# Patient Record
Sex: Female | Born: 1975 | Race: White | Hispanic: No | Marital: Single | Smoking: Never smoker
Health system: Southern US, Academic
[De-identification: ages and names within clinical notes are randomized; demographics above are authoritative.]

## PROBLEM LIST (undated history)

## (undated) DIAGNOSIS — K219 Gastro-esophageal reflux disease without esophagitis: Secondary | ICD-10-CM

## (undated) DIAGNOSIS — C4492 Squamous cell carcinoma of skin, unspecified: Secondary | ICD-10-CM

## (undated) DIAGNOSIS — M329 Systemic lupus erythematosus, unspecified: Secondary | ICD-10-CM

## (undated) DIAGNOSIS — F411 Generalized anxiety disorder: Secondary | ICD-10-CM

## (undated) DIAGNOSIS — M1990 Unspecified osteoarthritis, unspecified site: Secondary | ICD-10-CM

## (undated) DIAGNOSIS — N200 Calculus of kidney: Secondary | ICD-10-CM

## (undated) DIAGNOSIS — D649 Anemia, unspecified: Secondary | ICD-10-CM

## (undated) DIAGNOSIS — T879 Unspecified complications of amputation stump: Secondary | ICD-10-CM

## (undated) DIAGNOSIS — M797 Fibromyalgia: Secondary | ICD-10-CM

## (undated) DIAGNOSIS — E119 Type 2 diabetes mellitus without complications: Secondary | ICD-10-CM

## (undated) DIAGNOSIS — G5600 Carpal tunnel syndrome, unspecified upper limb: Secondary | ICD-10-CM

## (undated) DIAGNOSIS — E559 Vitamin D deficiency, unspecified: Secondary | ICD-10-CM

## (undated) DIAGNOSIS — Z8719 Personal history of other diseases of the digestive system: Secondary | ICD-10-CM

## (undated) DIAGNOSIS — K449 Diaphragmatic hernia without obstruction or gangrene: Secondary | ICD-10-CM

## (undated) DIAGNOSIS — F32A Depression, unspecified: Secondary | ICD-10-CM

## (undated) DIAGNOSIS — F4310 Post-traumatic stress disorder, unspecified: Secondary | ICD-10-CM

## (undated) DIAGNOSIS — G2581 Restless legs syndrome: Secondary | ICD-10-CM

## (undated) DIAGNOSIS — J45909 Unspecified asthma, uncomplicated: Secondary | ICD-10-CM

## (undated) DIAGNOSIS — G43909 Migraine, unspecified, not intractable, without status migrainosus: Secondary | ICD-10-CM

## (undated) HISTORY — PX: LEG AMPUTATION: SHX1105

## (undated) HISTORY — DX: Unspecified osteoarthritis, unspecified site: M19.90

## (undated) HISTORY — DX: Unspecified complications of amputation stump: T87.9

## (undated) HISTORY — DX: Vitamin D deficiency, unspecified: E55.9

## (undated) HISTORY — PX: LAMINECTOMY: SHX219

## (undated) HISTORY — DX: Squamous cell carcinoma of skin, unspecified: C44.92

## (undated) HISTORY — DX: Migraine, unspecified, not intractable, without status migrainosus: G43.909

## (undated) HISTORY — PX: SPINAL FUSION: SHX223

## (undated) HISTORY — DX: Unspecified asthma, uncomplicated: J45.909

## (undated) HISTORY — DX: Fibromyalgia: M79.7

## (undated) HISTORY — DX: Generalized anxiety disorder: F41.1

## (undated) HISTORY — PX: REPLACEMENT TOTAL KNEE BILATERAL: SUR1225

## (undated) HISTORY — DX: Calculus of kidney: N20.0

## (undated) HISTORY — DX: Post-traumatic stress disorder, unspecified: F43.10

## (undated) HISTORY — DX: Systemic lupus erythematosus, unspecified: M32.9

## (undated) HISTORY — DX: Type 2 diabetes mellitus without complications: E11.9

## (undated) HISTORY — DX: Diaphragmatic hernia without obstruction or gangrene: K44.9

## (undated) HISTORY — DX: Personal history of other diseases of the digestive system: Z87.19

## (undated) HISTORY — PX: HX CERVICAL DISKECTOMY: SHX98

## (undated) HISTORY — DX: Gastro-esophageal reflux disease without esophagitis: K21.9

## (undated) HISTORY — DX: Restless legs syndrome: G25.81

## (undated) HISTORY — PX: HX GALL BLADDER SURGERY/CHOLE: SHX55

## (undated) HISTORY — DX: Anemia, unspecified: D64.9

## (undated) HISTORY — DX: Depression, unspecified: F32.A

## (undated) HISTORY — PX: HX APPENDECTOMY: SHX54

## (undated) HISTORY — PX: HX GASTRIC BYPASS: SHX52

## (undated) HISTORY — DX: Carpal tunnel syndrome, unspecified upper limb: G56.00

## (undated) HISTORY — PX: HX TONSILLECTOMY: SHX27

---
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
## 2005-04-26 DIAGNOSIS — E119 Type 2 diabetes mellitus without complications: Secondary | ICD-10-CM | POA: Insufficient documentation

## 2005-04-26 DIAGNOSIS — J45909 Unspecified asthma, uncomplicated: Secondary | ICD-10-CM | POA: Insufficient documentation

## 2005-04-26 DIAGNOSIS — G4733 Obstructive sleep apnea (adult) (pediatric): Secondary | ICD-10-CM | POA: Insufficient documentation

## 2005-04-26 DIAGNOSIS — K219 Gastro-esophageal reflux disease without esophagitis: Secondary | ICD-10-CM | POA: Insufficient documentation

## 2005-04-26 DIAGNOSIS — M329 Systemic lupus erythematosus, unspecified: Secondary | ICD-10-CM | POA: Insufficient documentation

## 2005-12-18 DIAGNOSIS — E559 Vitamin D deficiency, unspecified: Secondary | ICD-10-CM | POA: Insufficient documentation

## 2006-08-15 DIAGNOSIS — I10 Essential (primary) hypertension: Secondary | ICD-10-CM | POA: Insufficient documentation

## 2006-08-15 DIAGNOSIS — F32A Depression, unspecified: Secondary | ICD-10-CM | POA: Insufficient documentation

## 2010-01-31 DIAGNOSIS — M797 Fibromyalgia: Secondary | ICD-10-CM | POA: Insufficient documentation

## 2010-01-31 DIAGNOSIS — G2581 Restless legs syndrome: Secondary | ICD-10-CM | POA: Insufficient documentation

## 2010-01-31 DIAGNOSIS — M179 Osteoarthritis of knee, unspecified: Secondary | ICD-10-CM | POA: Insufficient documentation

## 2010-01-31 DIAGNOSIS — G5600 Carpal tunnel syndrome, unspecified upper limb: Secondary | ICD-10-CM | POA: Insufficient documentation

## 2010-01-31 DIAGNOSIS — E669 Obesity, unspecified: Secondary | ICD-10-CM | POA: Insufficient documentation

## 2010-01-31 DIAGNOSIS — Z7952 Long term (current) use of systemic steroids: Secondary | ICD-10-CM | POA: Insufficient documentation

## 2019-07-29 NOTE — Progress Notes (Signed)
CARE TEAM:  Patient Care Team:  No Pcp as PCP - General (General Practice)    ASSESSMENT  1. Primary osteoarthritis of both knees    2. 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 Trochanteric bursitis of left hip       There is no problem list on file for this patient.    Impression:  44 year old female comes in today with primary osteoarthritis of both knees with severe degenerative joint disease of all 3 compartments of both knees.  She is a 47 BMI which will not allow her to have any type of surgical procedure until she gets under 40 BMI.  She does have some hip pain along the lateral sides which demonstrate trochanteric bursitis.      PLAN  Treatment Plan:  Left trochanteric bursa injection was performed today and was taken very well.  We did have a long discussion about her bilateral knee osteoarthritis the patient will need to lose weight because she desperately needs a total knee arthroplasty.  This will have to be in both knees because of considerable osteoarthritis and degenerative joint disease even at a young age of 37. We discussed cortisone injections as well as viscosupplementation injections and she has had both which helped for just some time however does not do anything along length of time.  We discussed diet weight control as well as obesity with her diabetes.  This will need to be taken control of and worked with possibly a dietitian which we can make a referral if possible.    Orders Placed This Encounter   . BMI >=25 PATIENT INSTRUCTIONS & EDUCATION   . BP Elevated Patient Education & Instructions      Follow-up: Return if symptoms worsen or fail to improve, for Follow Up.      HISTORY OF PRESENT ILLNESS  Chief Complaint: Pain of the Left Knee and Pain of the Right Knee     Age: 44 y.o.    Sex: female   Occupation:   Hand-dominance: Right    History of present illness: Ms. Bayliff presents today for evaluation of     Left Knee  Right Knee    Patient comes in today with bilateral knee pain she brought x-rays in that show severe osteoarthritis and degenerative joint disease.  She has had this ever since she was in her late 28s.  And is only gotten worse over time.  She has had injections of gel and cortisone physical therapy and knee even gastric bypass.  She does have diabetes that is well controlled however still gives her issues with a history of an autoimmune disease.  Today the patient is here for bilateral knee pain and left hip pain.  4/10 on the pain scale but does not wane or change        OBJECTIVE  Constitutional:  No acute distress. Her body mass index is 46.52 kg/m.   Eyes:  Sclera are nonicteric.  Respiratory:  No labored breathing.  Cardiovascular:  No marked edema.  Skin:  No marked skin ulcers.  Neurological:  No marked sensory loss noted.  Psychiatric: Alert and oriented x3.    Musculoskeletal     Knee  Right Knee ROM:  Flexion: with pain and 115 degrees  Extension: -3 degrees  Left Knee ROM:  Flexion: with pain and 115 degrees  Extension: -3 degrees  Skin:  Right  Knee: skin intact, no rashes or lesions.  Skin:  Left Knee: skin intact, no rashes or lesions.  Inspection:  Right Knee: medial joint line tenderness and varus alignment  Inspection:  Left Knee: medial joint line tenderness and varus alignment  Right Quadriceps: Strength: 5/5, normal muscle tone.  Left Quadriceps: Strength: 5/5, normal muscle tone.  Right Hamstring: Strength: 5/5, normal muscle tone.  Left Hamstring: Strength: 5/5, normal muscle tone.  Stability:  Right Knee: Stable  Stability:  Left Knee: Stable  Special:  Right Knee: McMurray Test: positive medial and Steinman's Grind Test: positive medial  Special:  Left Knee: McMurray Test: positive medial and Steinman's Grind Test: positive medial    Pinpoint tenderness over the left trochanteric bursa with a positive compression test  IMAGING / STUDIES         No imaging obtained   Bilateral  knee x-rays show severe degenerative joint disease of the medial joint space lateral joint space and patellofemoral joint space.  No fractures dislocations or subluxations however chronic osteophyte formation seen at the superior portion of the medial condyle and lateral condyle of both knees bilaterally.  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 as well    PROCEDURES  Large Joint Arthrocentesis: L greater trochanteric bursa  Date/Time: 07/29/2019 11:30 AM  Consent given by: patient  Site marked: site marked  Timeout: Immediately prior to procedure a time out was called to verify the correct patient, procedure, equipment, support staff and site/side marked as required   Supporting Documentation  Indications: pain   Procedure Details  Location: Hip L greater trochanteric bursa   Preparation: Patient was prepped and draped in the usual sterile fashion, ethylchloride used.Needle size: 25 G  Approach: lateral  Patient tolerance: patient tolerated the procedure well with no immediate complications      Medications administered: 1 mL Betamethasone 6 (3-3) MG/ML    Patient Education: Patient Education Distributed, Cortisone Flare Risk Discussed and Diabetes Risk Discussed          Supervising physician:Bart Jonathon Bellows

## 2020-04-06 IMAGING — MR MRI JOINT LOWER EXTREMITY WITHOUT CONTRAST LT
5 series · 38 of 40 positions shown · IV contrast (gadolinium)
Comparison: Radiographs dated 07/15/2019.

﻿EXAM:  MRI JOINT LOWER EXTREMITY WITHOUT CONTRAST LT KNEE
INDICATION: Chronic left knee pain.
TECHNIQUE: Multiplanar, multisequential MRI of the left knee joint was performed without gadolinium contrast.

[Series 5: PD fat-sat · axial · left · 4.0mm · 0.33mm/px · z∈[-54,+59]mm · 7 of 26 slices shown (1 of 3)]
[im 1/26]
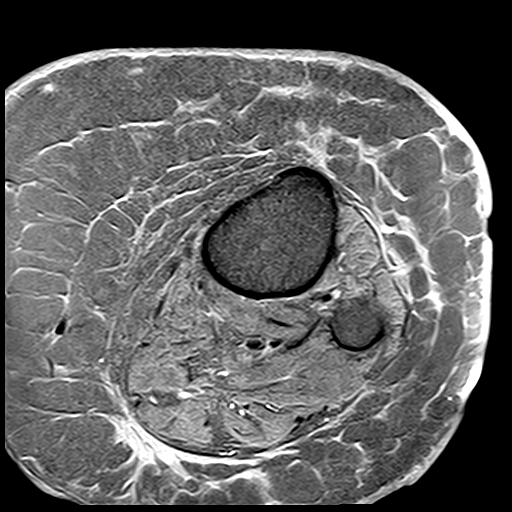
[im 5/26]
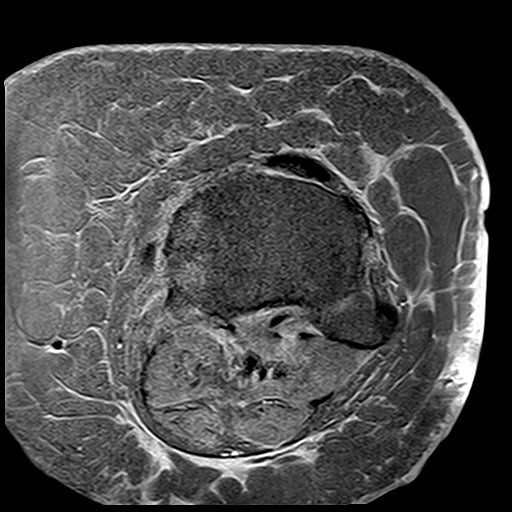
[im 9/26]
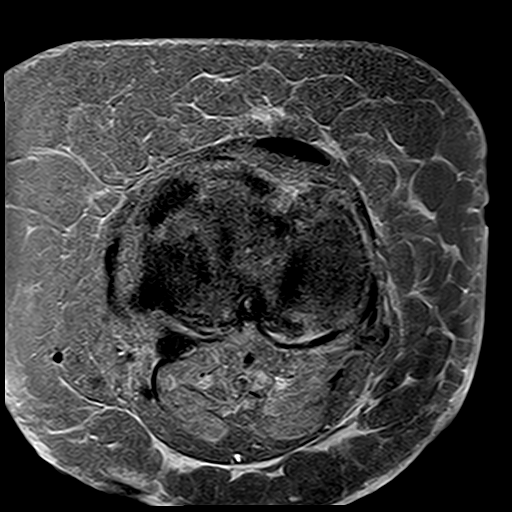
[im 13/26]
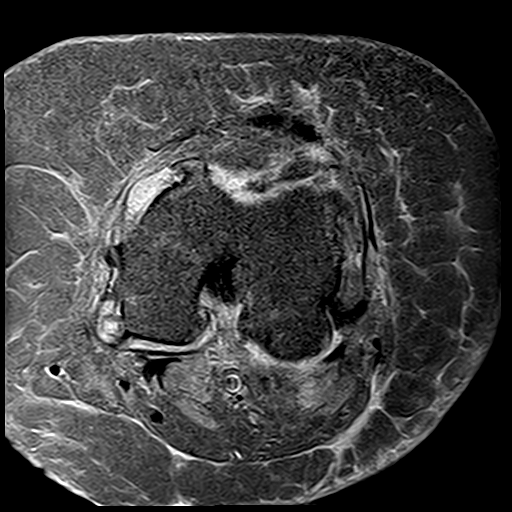
[im 17/26]
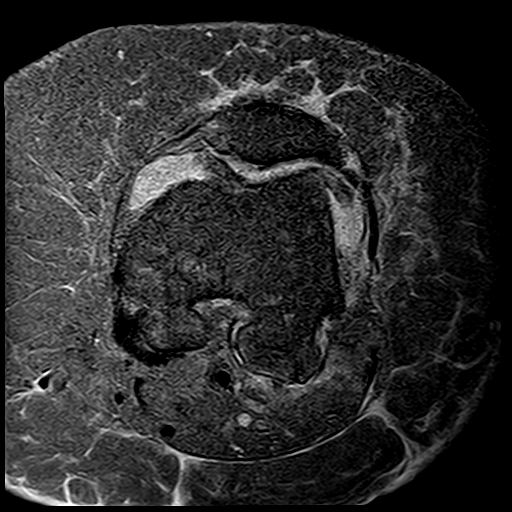
[im 21/26]
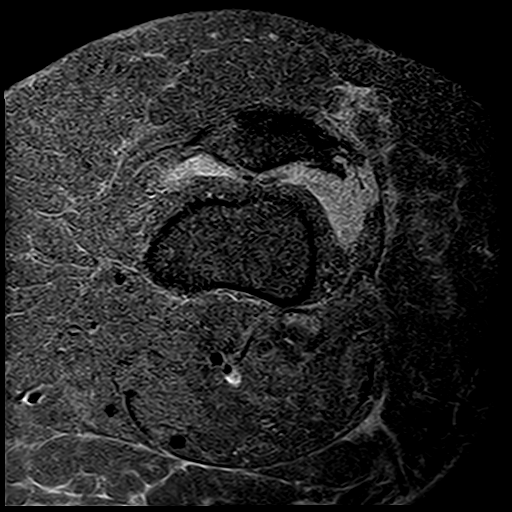
[im 26/26]
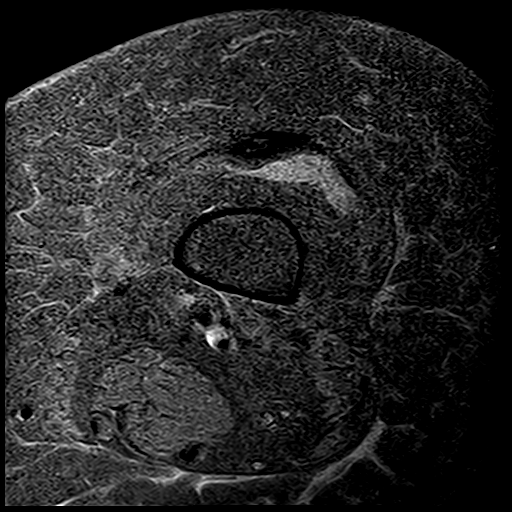

[Series 6: PD fat-sat · sagittal · left · 3.5mm · 0.53mm/px · 7 of 26 slices shown (2 of 3)]
[im 1/26]
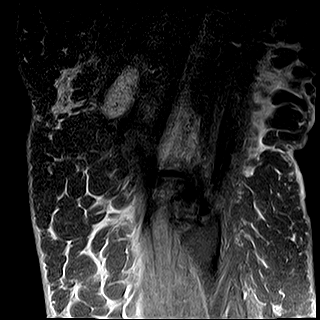
[im 5/26]
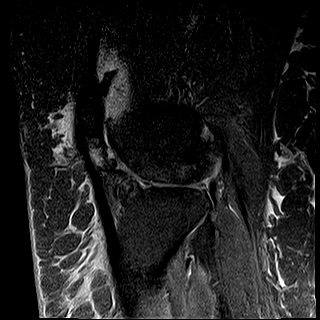
[im 9/26]
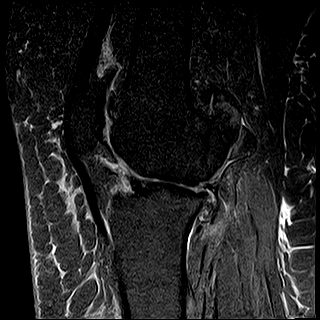
[im 13/26]
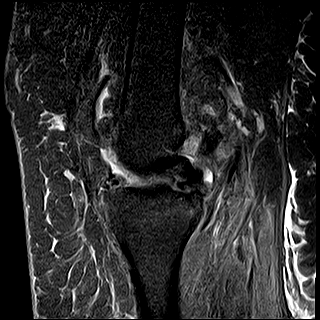
[im 17/26]
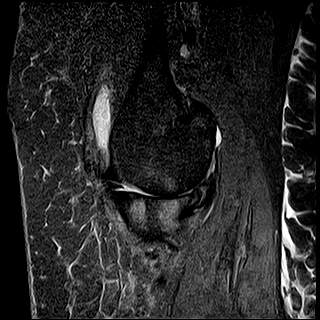
[im 21/26]
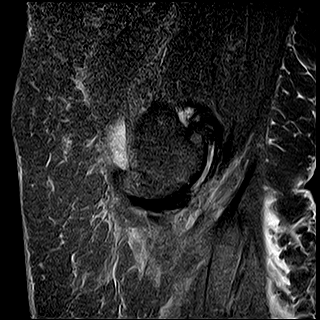
[im 26/26]
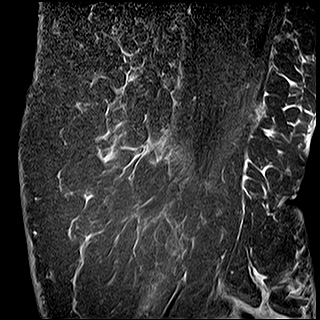

[Series 7: STIR · coronal · left · 3.5mm · 0.59mm/px · 7 of 30 slices shown]
[im 1/30]
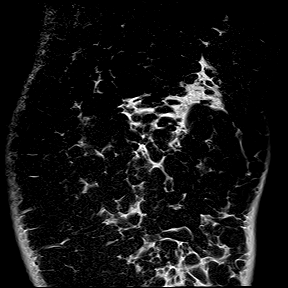
[im 4/30]
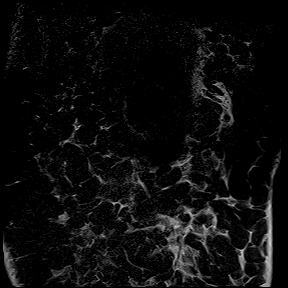
[im 8/30]
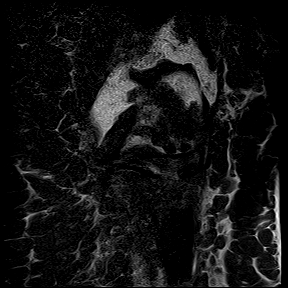
[im 11/30]
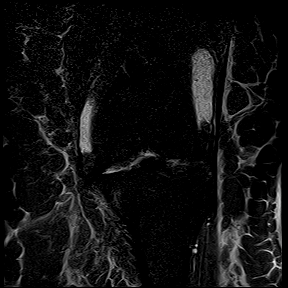
[im 19/30]
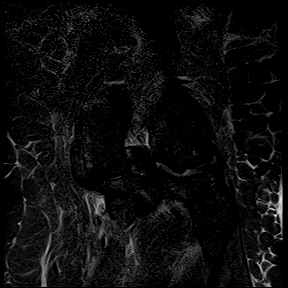
[im 22/30]
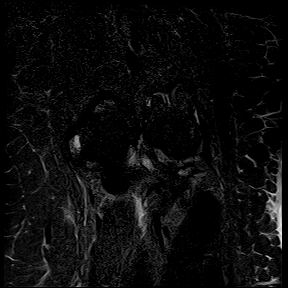
[im 26/30]
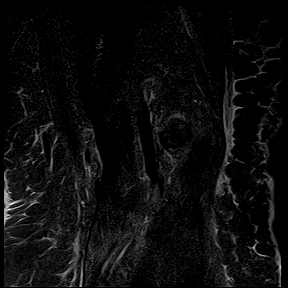

[Series 8: T1 · sagittal · left · 3.5mm · 0.53mm/px · 8 of 26 slices shown]
[im 1/26]
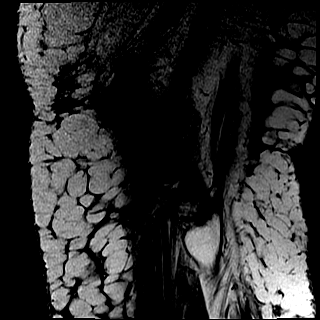
[im 4/26]
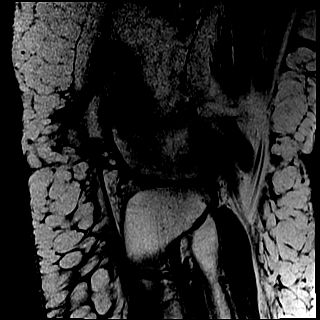
[im 8/26]
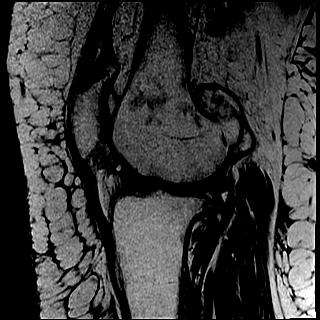
[im 11/26]
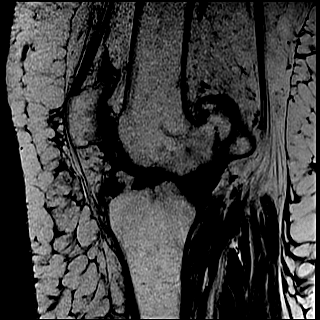
[im 15/26]
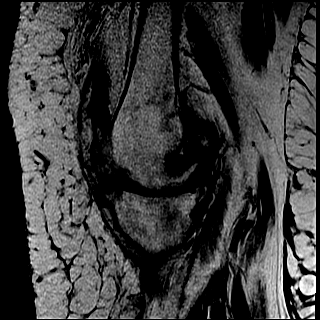
[im 18/26]
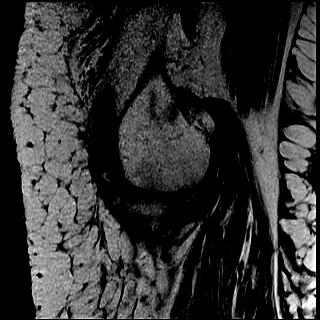
[im 22/26]
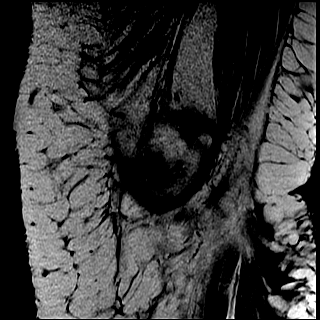
[im 26/26]
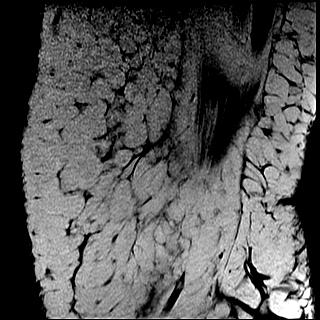

[Series 9: PD fat-sat · coronal · left · 3.5mm · 0.53mm/px · 9 of 30 slices shown (3 of 3)]
[im 1/30]
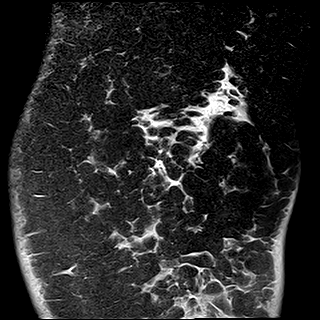
[im 4/30]
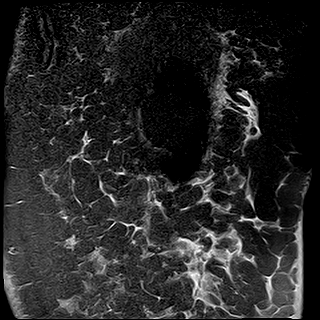
[im 8/30]
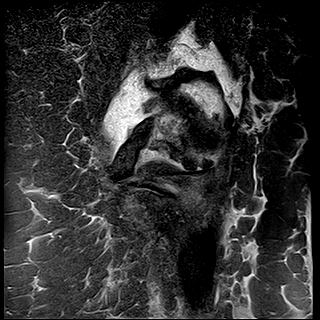
[im 11/30]
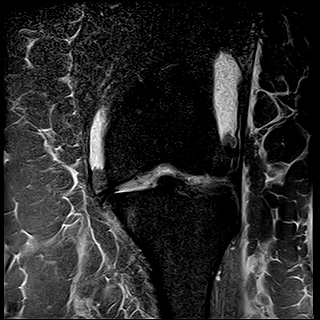
[im 15/30]
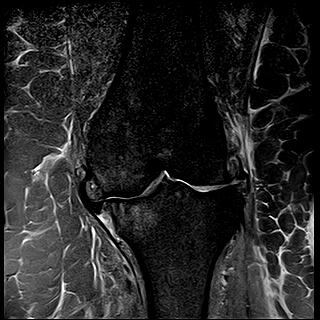
[im 19/30]
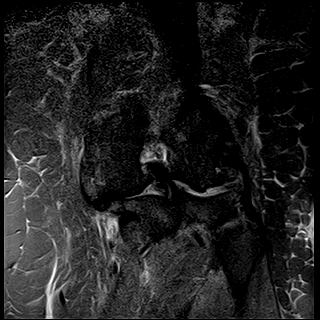
[im 22/30]
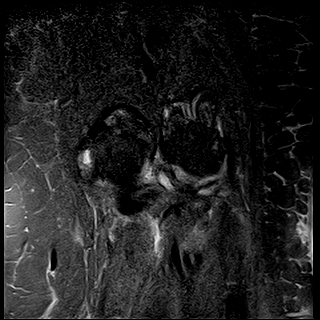
[im 26/30]
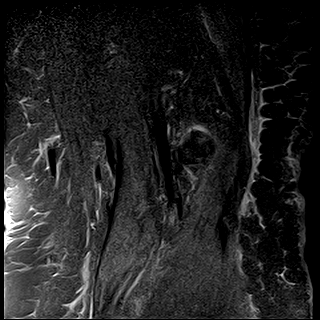
[im 30/30]
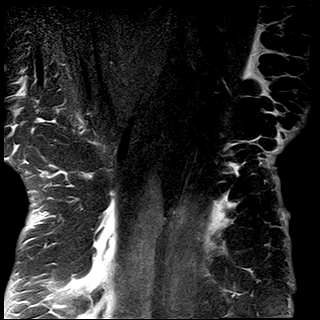

[38 of 40 positions shown; findings below may reference images not displayed]

FINDINGS: There are complex tears of the medial and lateral menisci.  The anterior cruciate ligament is not definitively visualized and may be chronically torn.  There is tricompartmental grade 4 chondromalacia.  Posterior cruciate and collateral ligaments appear intact.  Extensor mechanism is normal.  Bone marrow signal intensity is normal. There is a small suprapatellar effusion.  There is no Baker's cyst.
IMPRESSION: 1. Advanced osteoarthritis as detailed above. 

2. Suggestion of chronically torn anterior cruciate ligament.

## 2020-04-06 IMAGING — MR MRI JOINT LOWER EXTREMITY WITHOUT CONTRAST RT
5 series · 38 of 40 positions shown · IV contrast (gadolinium)
Comparison: Radiographs dated 07/15/2019.

﻿EXAM:  MRI JOINT LOWER EXTREMITY WITHOUT CONTRAST RT KNEE
INDICATION: Chronic pain.
TECHNIQUE: Multiplanar, multisequential MRI of the right knee joint was performed without gadolinium contrast.  A nonconventional knee coil was utilized due to patient’s extremely large body habitus.

[Series 5: PD fat-sat · axial · right · 4.0mm · 0.33mm/px · z∈[+12,+125]mm · 7 of 26 slices shown (1 of 3)]
[im 1/26]
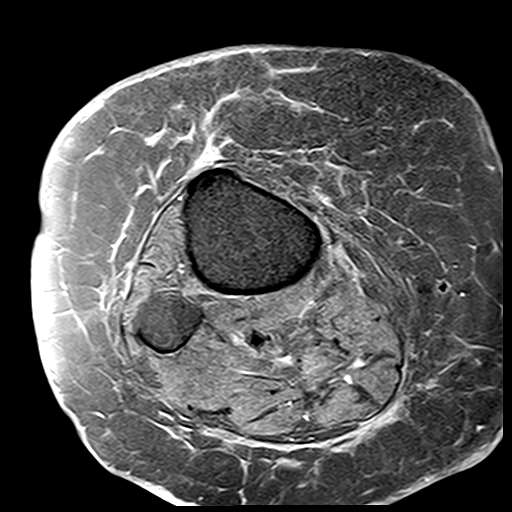
[im 5/26]
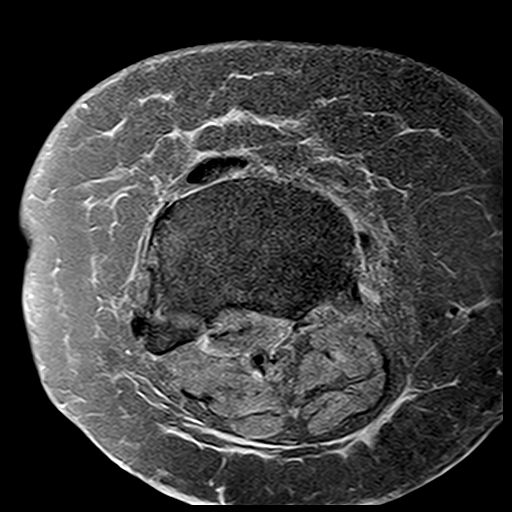
[im 9/26]
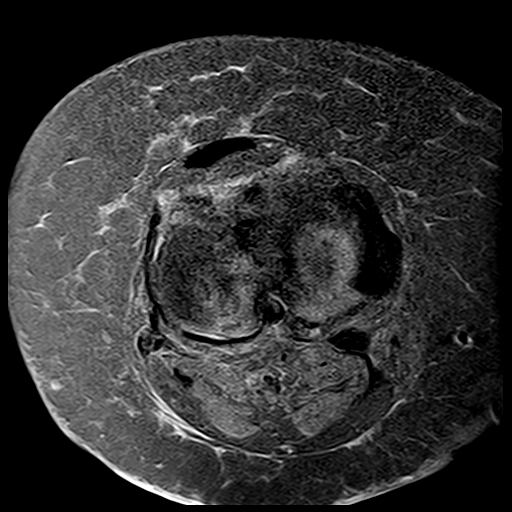
[im 13/26]
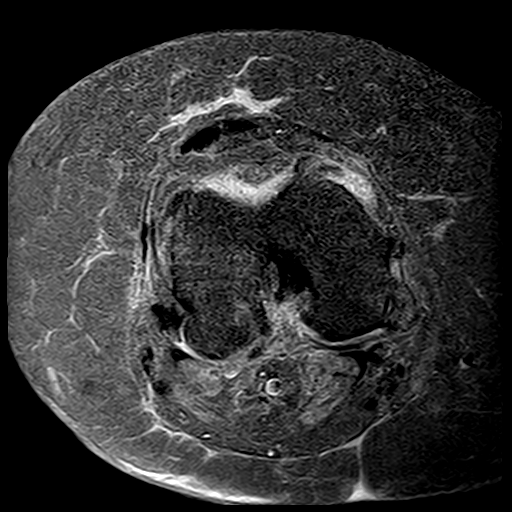
[im 17/26]
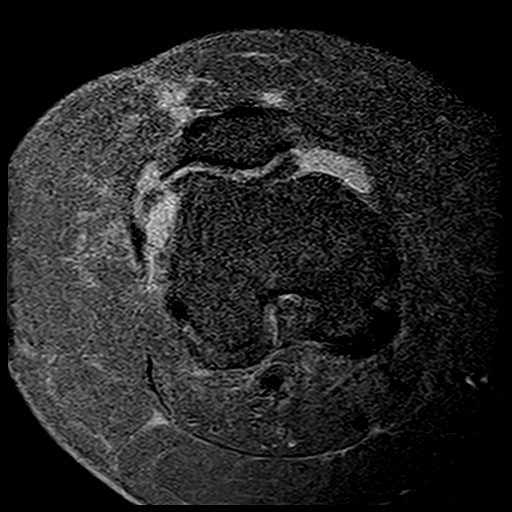
[im 21/26]
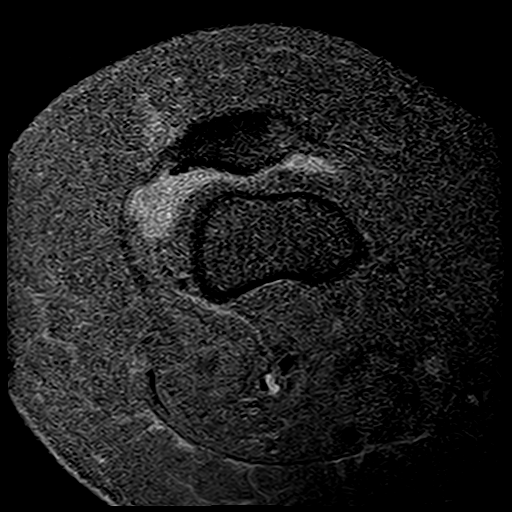
[im 26/26]
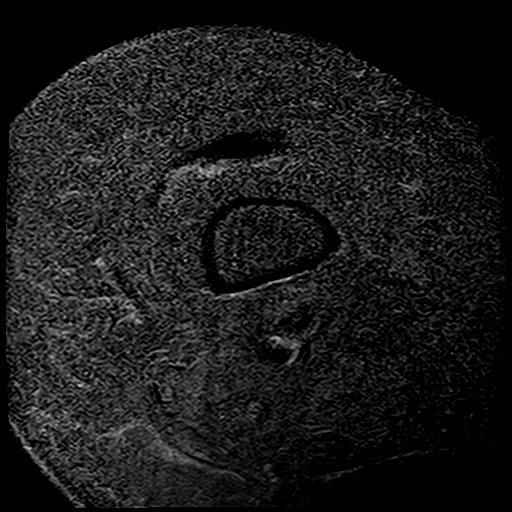

[Series 6: STIR · coronal · right · 3.5mm · 0.62mm/px · 6 of 30 slices shown]
[im 1/30]
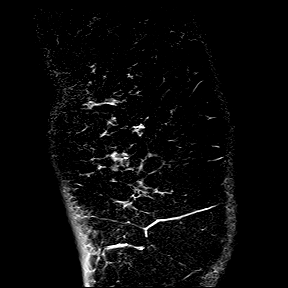
[im 5/30]
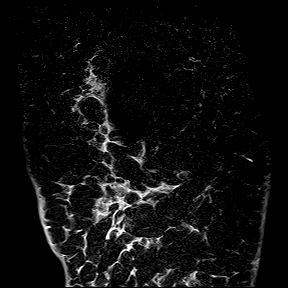
[im 9/30]
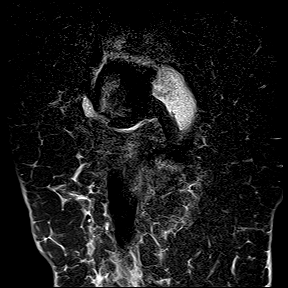
[im 13/30]
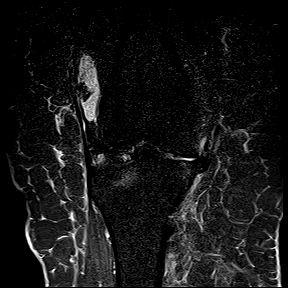
[im 17/30]
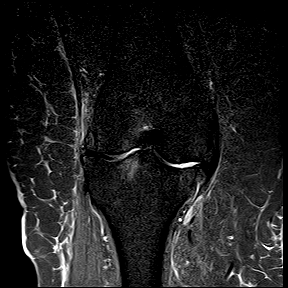
[im 21/30]
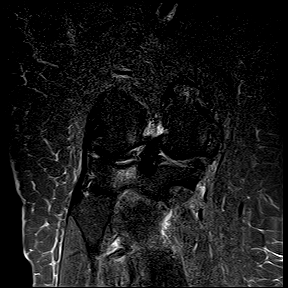

[Series 7: PD fat-sat · coronal · right · 3.5mm · 0.56mm/px · 9 of 30 slices shown (2 of 3)]
[im 1/30]
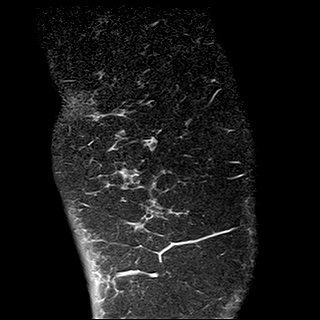
[im 4/30]
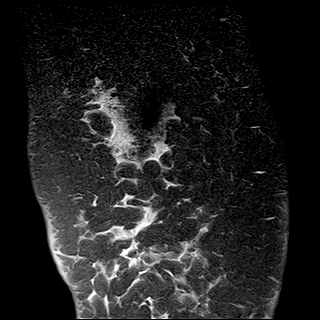
[im 8/30]
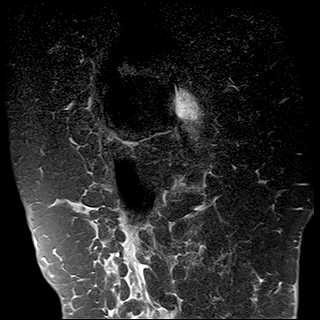
[im 11/30]
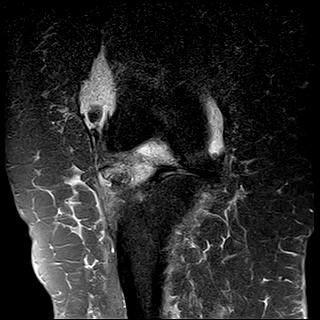
[im 15/30]
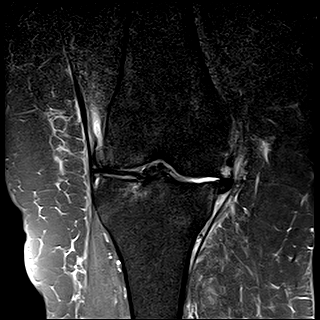
[im 19/30]
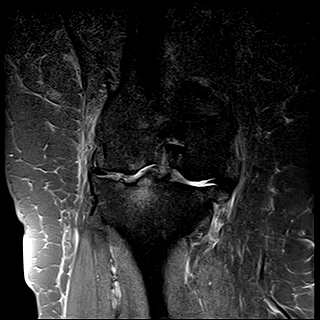
[im 22/30]
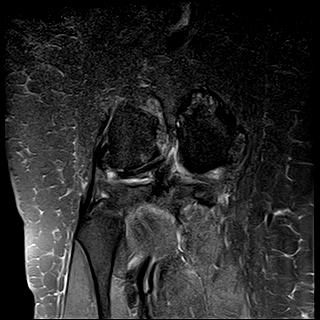
[im 26/30]
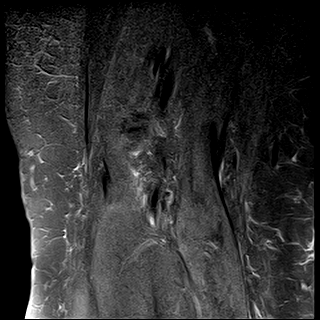
[im 30/30]
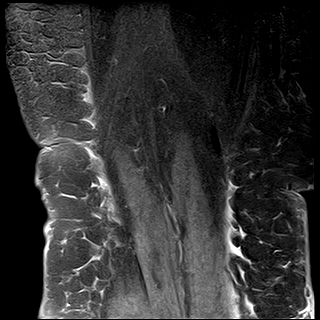

[Series 8: PD fat-sat · sagittal · right · 3.5mm · 0.53mm/px · 8 of 26 slices shown (3 of 3)]
[im 1/26]
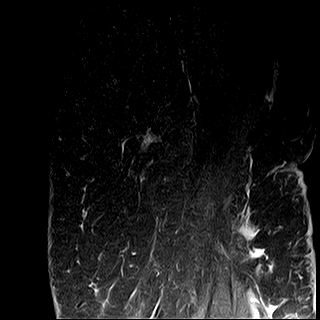
[im 4/26]
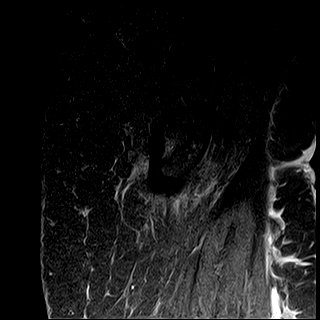
[im 8/26]
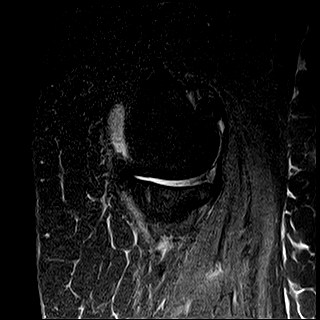
[im 11/26]
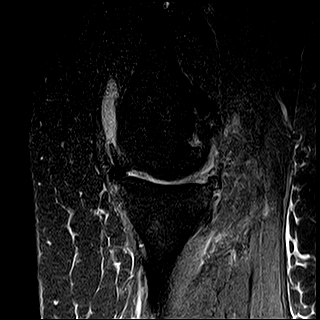
[im 15/26]
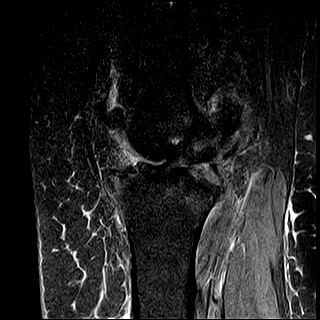
[im 18/26]
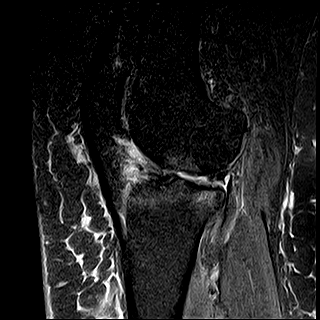
[im 22/26]
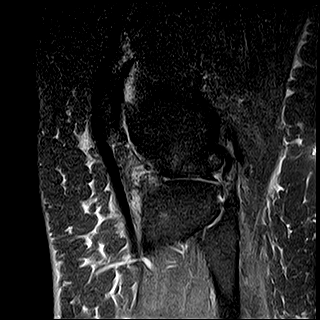
[im 26/26]
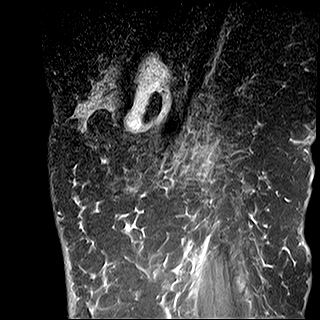

[Series 9: T1 · sagittal · right · 3.5mm · 0.53mm/px · 8 of 26 slices shown]
[im 1/26]
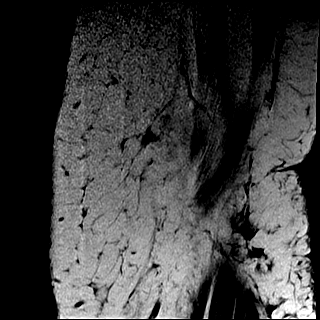
[im 4/26]
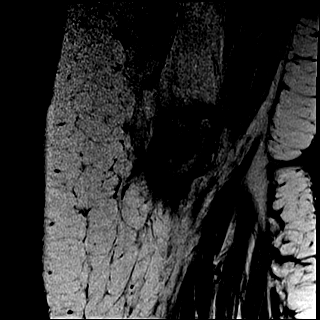
[im 8/26]
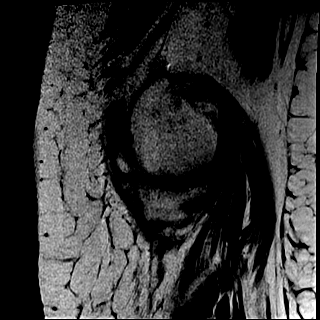
[im 11/26]
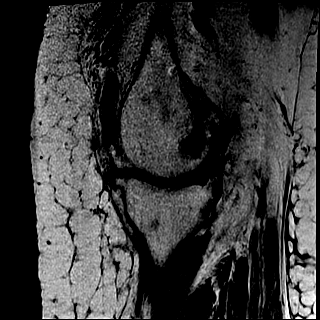
[im 15/26]
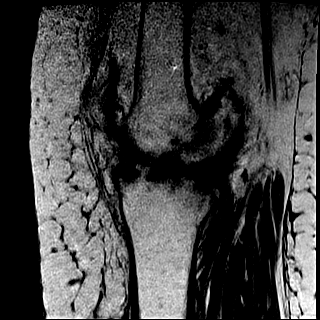
[im 18/26]
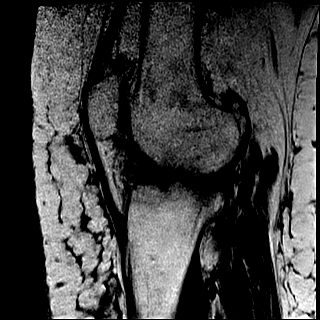
[im 22/26]
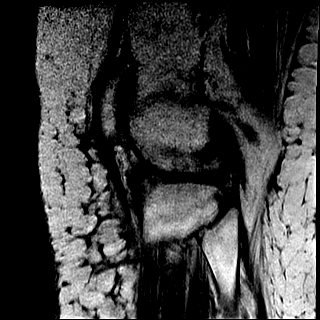
[im 26/26]
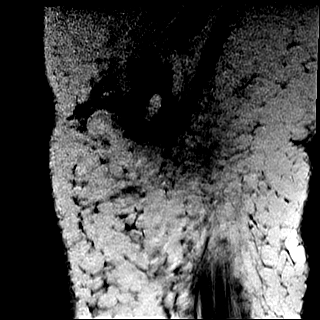

[38 of 40 positions shown; findings below may reference images not displayed]

FINDINGS: There are complex tears of the medial and lateral menisci.  Cruciate and collateral ligaments are intact, within normal limits in morphology and signal intensity.  There is tricompartmental grade 4 chondromalacia.  Extensor mechanism appears intact.  Bone marrow signal intensity is normal. There is no significant joint effusion or Baker's cyst.
IMPRESSION: Advanced osteoarthritis as detailed above.

## 2020-04-16 DIAGNOSIS — E119 Type 2 diabetes mellitus without complications: Secondary | ICD-10-CM | POA: Insufficient documentation

## 2020-04-16 DIAGNOSIS — M1711 Unilateral primary osteoarthritis, right knee: Secondary | ICD-10-CM | POA: Insufficient documentation

## 2020-06-09 NOTE — Telephone Encounter (Signed)
 Thank you   Faxed updated note [phone]  LM on [name]'s vm re: fax

## 2020-07-15 NOTE — Telephone Encounter (Signed)
Thanks

## 2020-08-30 DIAGNOSIS — R6521 Severe sepsis with septic shock: Secondary | ICD-10-CM | POA: Insufficient documentation

## 2020-09-19 DIAGNOSIS — M869 Osteomyelitis, unspecified: Secondary | ICD-10-CM | POA: Insufficient documentation

## 2020-10-31 DIAGNOSIS — Z89612 Acquired absence of left leg above knee: Secondary | ICD-10-CM | POA: Insufficient documentation

## 2020-11-02 DIAGNOSIS — T879 Unspecified complications of amputation stump: Secondary | ICD-10-CM | POA: Insufficient documentation

## 2021-06-24 ENCOUNTER — Other Ambulatory Visit (INDEPENDENT_AMBULATORY_CARE_PROVIDER_SITE_OTHER): Payer: Self-pay | Admitting: Family

## 2021-12-12 ENCOUNTER — Encounter (INDEPENDENT_AMBULATORY_CARE_PROVIDER_SITE_OTHER): Payer: Self-pay | Admitting: Surgery

## 2022-01-17 ENCOUNTER — Encounter (INDEPENDENT_AMBULATORY_CARE_PROVIDER_SITE_OTHER): Payer: Self-pay | Admitting: Student in an Organized Health Care Education/Training Program

## 2022-01-17 NOTE — Progress Notes (Signed)
46 yo female patient with urinary incontinence and OAB.    PCP: Dr. Danella Maiers    She has urgency and urge incontinence. She has occasional stress incontinence. She has nocturia x 1.    Bladder scan 12/09/2018 was 17 mL (not a true PVR).    She has taken oxybutynin er 5 mg bid. She stopped oxybutynin and started myrbetriq 50 mg after her initial visit.    She has a history of stone disease and followed with Dr. Debby Bud many years ago.    She has back pain with caffeine intake.    PSH includes gastric bypass.    Her mother had kidney cancer.    She uses 10-12 pads in a 24 hour period. She also uses blue chux pads.    UA 01/06/19 was essentially negative.    Renal U/S 08/04/19 at [facility] was unremarkable.    UA 08/12/19 showed glucose and was otherwise negative.    03/24/20 urime culture: >100,000 CFU E. coli    03/30/20 UA nitrite positive.    Assessment & Plan  (1) OAB (overactive bladder):        Status: Acute        Code(s):  N32.81 - Overactive bladder  (2) UTI (urinary tract infection):        Status: Acute        Code(s):  N39.0 - Urinary tract infection, site not specified        Qualifiers:          Urinary tract infection type: site unspecified  Hematuria presence: without hematuria  Qualified Code(s): N39.0 - Urinary tract infection, site not specified    Plan  Continue with nitrofurantoin. She hasn't yet started gemtesa. Follow-up in 6 months. She will repeat UA once she finishes antibiotic course.

## 2022-01-19 ENCOUNTER — Encounter (INDEPENDENT_AMBULATORY_CARE_PROVIDER_SITE_OTHER): Payer: Self-pay | Admitting: Student in an Organized Health Care Education/Training Program

## 2022-01-19 ENCOUNTER — Ambulatory Visit (INDEPENDENT_AMBULATORY_CARE_PROVIDER_SITE_OTHER): Payer: Commercial Managed Care - PPO | Admitting: Student in an Organized Health Care Education/Training Program

## 2022-01-19 ENCOUNTER — Other Ambulatory Visit: Payer: Self-pay

## 2022-01-19 VITALS — BP 130/79 | HR 81 | Ht 67.0 in | Wt 225.0 lb

## 2022-01-19 DIAGNOSIS — N200 Calculus of kidney: Secondary | ICD-10-CM

## 2022-01-19 DIAGNOSIS — N3941 Urge incontinence: Secondary | ICD-10-CM

## 2022-01-19 DIAGNOSIS — N3281 Overactive bladder: Secondary | ICD-10-CM

## 2022-01-19 DIAGNOSIS — N393 Stress incontinence (female) (male): Secondary | ICD-10-CM

## 2022-01-19 MED ORDER — MYRBETRIQ 50 MG TABLET,EXTENDED RELEASE
1.0000 | ORAL_TABLET | Freq: Every day | ORAL | 3 refills | Status: DC
Start: 2022-01-19 — End: 2022-09-05

## 2022-01-19 MED ORDER — SOLIFENACIN 10 MG TABLET
10.0000 mg | ORAL_TABLET | Freq: Every day | ORAL | 3 refills | Status: DC
Start: 2022-01-19 — End: 2022-09-05

## 2022-01-19 NOTE — Progress Notes (Signed)
Abigail Carlson    Progress Note    Name: Abigail Carlson MRN:  [id number]   Date: 01/19/2022 Age: 46 y.o.       Chief Complaint: Follow Up (Follow up on urinary incontinence/)    Subjective:     Abigail Carlson is a pleasant 46 year old female with a history of urge urinary incontinence as well as stress urinary incontinence.  She is on Myrbetriq 50 mg for her overactive bladder in his happy with the results.  She has to wear 0-1 pads per day.  She does not have significant bother from her incontinence.  She reports a history of kidney stones. She denies fevers, chills, nausea, vomiting, hematuria, dysuria, flank pain, incontinence, dribbling, hesitancy, suprapubic pain, headaches, vision changes, shortness of breath, chest pain.      Objective :  BP 130/79 (Site: Right, Patient Position: Sitting, Cuff Size: Adult)   Pulse 81   Ht 1.702 m ('5\' 7"'$ )   Wt 102 kg (225 lb)   BMI 35.24 kg/m       Gen: NAD, alert  Pulm: unlabored at rest  CV: palpable pulses  Abd: soft, Nt/ND  GU: no suprapubic tenderness, no CVAT    Data reviewed:    Current Outpatient Medications   Medication Sig    acetaminophen (TYLENOL) 500 mg Oral Tablet Take 2 Tablets (1,000 mg total) by mouth    albuterol sulfate (PROVENTIL OR VENTOLIN OR PROAIR) 90 mcg/actuation Inhalation oral inhaler Take 1-2 Puffs by inhalation Every 6 hours as needed    ALPRAZolam (XANAX) 0.5 mg Oral Tablet Take 1 Tablet (0.5 mg total) by mouth Every night as needed for Insomnia    aspirin 81 mg Oral Tablet, Chewable Chew 1 Tablet (81 mg total) Once a day    cyanocobalamin (VITAMIN B12) 1,000 mcg/mL Injection Solution 1 mL (1,000 mcg total) Once a day    Dexlansoprazole (DEXILANT) 60 mg Oral Cap, Delayed Rel., Multiphasic Take 1 Capsule (60 mg total) by mouth Once a day    Diaper,Brief, Adult,Disposable Misc     gabapentin (NEURONTIN) 300 mg Oral Capsule Take 1 Capsule (300 mg total) by mouth    gabapentin (NEURONTIN) 600 mg Oral Tablet Take 1  Tablet (600 mg total) by mouth Three times a day    hydroxychloroquine (PLAQUENIL) 200 mg Oral Tablet Take 1 Tablet (200 mg total) by mouth Once a day    metFORMIN (GLUCOPHAGE) 500 mg Oral Tablet     methocarbamoL (ROBAXIN) 750 mg Oral Tablet Take 1 Tablet (750 mg total) by mouth Four times a day    metoprolol tartrate (LOPRESSOR) 25 mg Oral Tablet Take 1 Tablet (25 mg total) by mouth    MYRBETRIQ 50 mg Oral Tablet Sustained Release 24 hr TAKE 1 TABLET BY MOUTH EVERY DAY    Oxycodone (ROXICODONE) 10 mg Oral Tablet Take 1 Tablet (10 mg total) by mouth    rOPINIRole (REQUIP) 1 mg Oral Tablet Take 1-2 Tablets (1-2 mg total) by mouth    vibegron (GEMTESA) 75 mg Oral Tablet Take 1 Tablet (75 mg total) by mouth Once a day     Assessment/Plan  Problem List Items Addressed This Visit    None    Overactive bladder with Urge urinary incontinence on myrebtriq 50 mg  I discussed the differential diagnosis, pathophysiology and nature of patient's overactive bladder/urge urinary incontinence  I also counseled patient on conservative management options including appropriate fluid management, avoidance of diuretics including caffeine and  alcohol, weight loss (if applicable), dedicated pelvic floor muscle therapy and Kegel's exercises  Additionally, we discussed the role of pharmacotherapy, including risks, benefits and alternatives:  Anticholinergic therapy (e.g. Oxybutynin, Tolterodine, Solifenacin, etc)- discussed potential risks of dry mouth, dry eyes, constipation, impaired cognition, prolonged Q-T interval and urinary retention  Beta-3 agonist therapy (e.g. Mirabegron) - discussed potential risks of hypertension, nasopharyngitis, urinary tract infection and headache  Refill provided for Mirabegron (MyrbetriqT) 50 mg P.O. daily:    I have discussed in great detail with the patient the treatment of urge incontinence/overactive bladder symptoms using mirabegron .  I have explained my rationale for using mirabegron as well as  potential risks of hypertension (11.3%), nasopharyngitis (3.9%), urinary tract infection (4.2%), and tachycardia (1.6%). Patient was also cautioned that full therapeutic efficacy may take up to twelve weeks.  Patient expressed an understanding of the treatment, possible reactions, and possible prognosis.      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  Given the bothersome nature of her symptoms, we discussed potential treatment options including:  Conservative management, including pelvic floor "Kegel" exercises, which is generally reserved for mild cases  Estrogen replacement therapy either primarily or as an adjunct, when not contraindicated, to restore blood flow to the female pelvic organs to aid in preserving vascularity to her pelvic floor  Urethral bulking agents  Synthetic midurethral sling delivered either a retropubic or transobturator approach  Bladder neck sling utilizing autologous fascia  We also discussed the incidence, nature and risks of de novo urge urinary incontinence following treatment of stress urinary incontinence, particularly following midurethral sling  The patient has agreed to undergo cystoscopy with Retropubic midurethral sling and understands that the risks include but are not limited to bleeding, infection, damage to adjacent tissues, therapeutic failure, injury/erosion into bladder/bowel/urethra/ureters, postoperative pain, possibly long term, possible need for post-operative drains/stents, need for additional procedures.  Additionally, we discussed the 6294 FDA Public Health Notification on "Serious Complications Associated with Transvaginal Placement of Surgical Mesh in Repair of ... Stress Urinary Incontinence" including complete failure, de novo pelvic organ prolapse, foreign body reaction, fistula formation, infection, graft  erosion/extrusion/migration, nerve damage, pain, urinary tract obstruction, urinary retention, vaginal contracture, wound dehiscence and sexual dysfunction.      History of nephrolithiasis  I discussed the differential diagnosis, pathophysiology and nature of urolithiasis and recurrent stone disease.  We also reviewed the recent 2014 American Urological Association guideline on "Medical Management of Kidney Stones" and specifically discussed dietary therapies including:  Increase fluid intake to achieve urine output of at least 2.5 liters daily  Limit sodium intake to no more than 100 mEq (2,300 mg) per day  Consume 1,000-1,200 mg of dietary calcium per day  Limit oxalate-rich foods (beets, spinach, rhubarb, nuts, chocolate)  Limit non-dairy animal protein  Encouraged incresed fruit and vegetable intake        Landis Gandy, DO     A combined total of 45 minutes were spent preparing to see the patient, reviewing previous records, ordering tests/medications/procedures, documenting the clinical encounter as well as performing a medically appropriate evaluation and independently interpreting results and communicating them to the patient/family/caregiver as specifically outlined above in the impression and plan.

## 2022-02-06 ENCOUNTER — Encounter (HOSPITAL_COMMUNITY): Payer: Self-pay | Admitting: NURSE PRACTITIONER

## 2022-02-06 ENCOUNTER — Ambulatory Visit: Payer: Commercial Managed Care - PPO | Attending: NURSE PRACTITIONER | Admitting: NURSE PRACTITIONER

## 2022-02-06 ENCOUNTER — Other Ambulatory Visit: Payer: Self-pay

## 2022-02-06 VITALS — BP 137/85 | HR 97 | Temp 98.4°F | Ht 67.0 in | Wt 217.0 lb

## 2022-02-06 DIAGNOSIS — E559 Vitamin D deficiency, unspecified: Secondary | ICD-10-CM | POA: Insufficient documentation

## 2022-02-06 DIAGNOSIS — Z9884 Bariatric surgery status: Secondary | ICD-10-CM | POA: Insufficient documentation

## 2022-02-06 DIAGNOSIS — Z8051 Family history of malignant neoplasm of kidney: Secondary | ICD-10-CM | POA: Insufficient documentation

## 2022-02-06 DIAGNOSIS — F331 Major depressive disorder, recurrent, moderate: Secondary | ICD-10-CM | POA: Insufficient documentation

## 2022-02-06 DIAGNOSIS — M797 Fibromyalgia: Secondary | ICD-10-CM | POA: Insufficient documentation

## 2022-02-06 DIAGNOSIS — Z79899 Other long term (current) drug therapy: Secondary | ICD-10-CM | POA: Insufficient documentation

## 2022-02-06 DIAGNOSIS — D8481 Immunodeficiency due to conditions classified elsewhere: Secondary | ICD-10-CM | POA: Insufficient documentation

## 2022-02-06 DIAGNOSIS — F10921 Alcohol use, unspecified with intoxication delirium: Secondary | ICD-10-CM | POA: Insufficient documentation

## 2022-02-06 DIAGNOSIS — Z89612 Acquired absence of left leg above knee: Secondary | ICD-10-CM | POA: Insufficient documentation

## 2022-02-06 DIAGNOSIS — G9529 Other cord compression: Secondary | ICD-10-CM | POA: Insufficient documentation

## 2022-02-06 DIAGNOSIS — F1090 Alcohol use, unspecified, uncomplicated: Secondary | ICD-10-CM | POA: Insufficient documentation

## 2022-02-06 DIAGNOSIS — E46 Unspecified protein-calorie malnutrition: Secondary | ICD-10-CM | POA: Insufficient documentation

## 2022-02-06 DIAGNOSIS — Z85828 Personal history of other malignant neoplasm of skin: Secondary | ICD-10-CM | POA: Insufficient documentation

## 2022-02-06 DIAGNOSIS — D509 Iron deficiency anemia, unspecified: Secondary | ICD-10-CM | POA: Insufficient documentation

## 2022-02-06 DIAGNOSIS — N1832 Chronic kidney disease, stage 3b: Secondary | ICD-10-CM | POA: Insufficient documentation

## 2022-02-06 MED ORDER — CHOLECALCIFEROL (VITAMIN D3) 25 MCG (1,000 UNIT) TABLET
1000.0000 [IU] | ORAL_TABLET | Freq: Every day | ORAL | 3 refills | Status: AC
Start: 2022-02-06 — End: ?

## 2022-02-06 MED ORDER — FERROUS SULFATE 325 MG (65 MG IRON) TABLET
325.0000 mg | ORAL_TABLET | ORAL | 3 refills | Status: DC
Start: 2022-02-06 — End: 2022-09-19

## 2022-02-06 NOTE — H&P (Signed)
Department of Hematology/Oncology  History and Physical    Name: Abigail Carlson  FIE:[id number]  Date of Birth: Oct 18, 1975  Encounter Date: 02/06/2022    REFERRING PROVIDER:  No referring provider defined for this encounter.    REASON FOR OFFICE VISIT:  A patient previously seen at [facility] for evaluation and management of  iron-deficiency anemia.    HISTORY OF PRESENT ILLNESS:  Abigail Carlson is a 46 y.o. female who presents today for iron-deficiency anemia.  She was initially seen at [facility] on October 08, 2013.  She was last seen on 07/11/2020 at [facility].  She has had Imferon in the past and tolerated it well.  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  The patient states that she may need to go through a surgery on her back in the near future.  She is following up at [facility] clinic for this.       ROS:   Review of Systems   Constitutional:  Positive for appetite change (decreased) and fatigue.   Gastrointestinal:  Positive for nausea.   Musculoskeletal:  Positive for gait problem (due to amputation).   Neurological:  Positive for gait problem (due to amputation).   Psychiatric/Behavioral:  Positive for depression.         History:  Past Medical History:   Diagnosis Date    AKA stump complication (CMS HCC)     Anemia     Anxiety state     Asthma     Carpal tunnel syndrome     Depression     Diabetes mellitus, type 2 (CMS HCC)     Esophageal reflux     Fibromyalgia     Hiatal hernia     History of IBS     Hypovitaminosis D     Kidney stones     Migraines     Osteoarthritis     PTSD (post-traumatic stress disorder)     RLS (restless legs syndrome)     SCC (squamous cell carcinoma)     Systemic lupus erythematosus (CMS HCC)            Past Surgical History:   Procedure Laterality Date    HX APPENDECTOMY      HX  CERVICAL DISKECTOMY      HX CHOLECYSTECTOMY      HX GASTRIC BYPASS      HX TONSILLECTOMY      LAMINECTOMY      LEG AMPUTATION Left     REPLACEMENT TOTAL KNEE BILATERAL      SPINAL FUSION             Social History     Socioeconomic History    Marital status: Single     Spouse name: Not on file    Number of children: Not on file    Years of education: Not on file    Highest education level: Not on file   Occupational History    Not on file   Tobacco Use    Smoking status: Never    Smokeless tobacco: Never   Vaping Use    Vaping Use: Never used   Substance and Sexual Activity    Alcohol use: Never    Drug use: Never    Sexual activity: Not on file   Other Topics Concern    Not on  file   Social History Narrative    Not on file     Social Determinants of Health     Financial Resource Strain: Not on file   Transportation Needs: Not on file   Social Connections: Not on file   Intimate Partner Violence: Not on file   Housing Stability: Not on file       Social History     Social History Narrative    Not on file       Social History     Substance and Sexual Activity   Drug Use Never       Family Medical History:       Problem Relation (Age of Onset)    Elevated Lipids Father    Heart Disease Father    Hypertension (High Blood Pressure) Mother    Kidney Cancer Mother    Migraines Mother              Current Outpatient Medications   Medication Sig    acetaminophen (TYLENOL) 500 mg Oral Tablet Take 2 Tablets (1,000 mg total) by mouth    albuterol sulfate (PROVENTIL OR VENTOLIN OR PROAIR) 90 mcg/actuation Inhalation oral inhaler Take 1-2 Puffs by inhalation Every 6 hours as needed    ALPRAZolam (XANAX) 0.5 mg Oral Tablet Take 1 Tablet (0.5 mg total) by mouth Every night as needed for Insomnia    ascorbic acid, vitamin C, (VITAMIN C) 250 mg Oral Tablet Take 1 Tablet (250 mg total) by mouth    aspirin 81 mg Oral Tablet, Chewable Chew 1 Tablet (81 mg total) Once a day    Blood Sugar Diagnostic Strip 1 Strip    buPROPion  (WELLBUTRIN XL) 150 mg extended release 24 hr tablet Take 2 Tablets (300 mg total) by mouth    calcium carbonate-vitamin D3 500 mg-5 mcg (200 unit) Oral Tablet Take 1 Tablet by mouth    cholecalciferol, vitamin D3, 25 mcg (1,000 unit) Oral Tablet Take 1 Tablet (1,000 Units total) by mouth Once a day    cyanocobalamin (VITAMIN B12) 1,000 mcg/mL Injection Solution 1 mL (1,000 mcg total) Once a day    Dexlansoprazole (DEXILANT) 60 mg Oral Cap, Delayed Rel., Multiphasic Take 1 Capsule (60 mg total) by mouth Once a day    Diaper,Brief, Adult,Disposable Misc     diphenoxylate-atropine (LOMOTIL) 2.5-0.025 mg Oral Tablet     ferrous sulfate (FEOSOL) 325 mg (65 mg iron) Oral Tablet Take 1 Tablet (325 mg total) by mouth Every other day    gabapentin (NEURONTIN) 600 mg Oral Tablet Take 1 Tablet (600 mg total) by mouth Three times a day    hydrocortisone 2.5 % Cream two times daily .    hydroxychloroquine (PLAQUENIL) 200 mg Oral Tablet Take 1 Tablet (200 mg total) by mouth Once a day    Levetiracetam 500 mg Oral Tablet Sustained Release 24 hr Take 1 Tablet (500 mg total) by mouth Every night    medroxyPROGESTERone (DEPO-PROVERA) 150 mg/mL IntraMUSCULAR Syringe Inject 1 mL (150 mg total) into the muscle    melatonin 3 mg Oral Tablet Take 1 Tablet (3 mg total) by mouth    metFORMIN (GLUCOPHAGE) 500 mg Oral Tablet     methocarbamoL (ROBAXIN) 500 mg Oral Tablet TAKE 1 TABLET FOUR TIMES DAILY AS NEEDED BY MOUTH FOR MUSCLE SPASMS OR PAIN.    metoprolol tartrate (LOPRESSOR) 25 mg Oral Tablet Take 1 Tablet (25 mg total) by mouth    mirabegron (MYRBETRIQ) 50 mg Oral Tablet Sustained  Release 24 hr Take 1 Tablet (50 mg total) by mouth Once a day    multivitamin with minerals Oral Tablet Take 1 Tablet by mouth    naloxone (NARCAN) 4 mg per spray nasal spray INHALE 1 SPRAY (4 MG) INTO NOSTRIL ONE TIME IF NEEDED    naproxen sodium (ANAPROX) 550 mg Oral Tablet Take 1 Tablet (550 mg total) by mouth    nystatin (NYSTOP) 100,000 unit/gram Powder  Apply topically    ondansetron (ZOFRAN) 8 mg Oral Tablet     Oxycodone (ROXICODONE) 10 mg Oral Tablet Take 1 Tablet (10 mg total) by mouth    phentermine (ADIPEX-P) 37.5 mg Oral Tablet Take 1 Tablet (37.5 mg total) by mouth    prochlorperazine (COMPAZINE) 10 mg Oral Tablet Take 1 Tablet (10 mg total) by mouth    riboflavin, vitamin B2, (VITAMIN B-2) 100 mg Oral Tablet Take 1 Tablet (100 mg total) by mouth    rizatriptan (MAXALT) 10 mg Oral Tablet TAKE 1 TABLET AS NEEDED ON ONSET OF HEADACHE, MAY REPEAT AFTER 2 HOURS UP TO 2 IN 24 HOURS    rOPINIRole (REQUIP) 1 mg Oral Tablet Take 1-2 Tablets (1-2 mg total) by mouth    solifenacin (VESICARE) 10 mg Oral Tablet Take 1 Tablet (10 mg total) by mouth Once a day    SYMBICORT 160-4.5 mcg/actuation Inhalation oral inhaler 2 Puffs Twice daily    TRINTELLIX 10 mg Oral Tablet TAKE 1 TABLET (10 MG) BY MOUTH DAILY.    vitamin A (AQUASOL A) 3,000 mcg (10,000 unit) Oral Capsule Take 1 Capsule (10,000 Units total) by mouth Once a day 1 a day    vitamin E 400 unit Oral Capsule Take 1 Capsule (400 Units total) by mouth Once a day       Allergies   Allergen Reactions    Augmentin [Amoxicillin-Pot Clavulanate]     Azithromycin     Celebrex [Celecoxib]     Cephalosporins     Chlorhexidine Towelette     Clavulanic Acid     Codeine     Cymbalta [Duloxetine]     Penicillins     Tramadol          PHYSICAL EXAM:  BP 137/85 (Site: Left, Patient Position: Sitting, Cuff Size: Adult)   Pulse 97   Temp 36.9 C (98.4 F) (Temporal)   Ht 1.702 m ('5\' 7"'$ )   Wt 98.4 kg (217 lb)   BMI 33.99 kg/m      ECOG Status: (3) Capable of limited self-care, confined to bed or chair > 50% of waking hours   Physical Exam  HENT:      Mouth/Throat:      Mouth: Mucous membranes are moist.      Pharynx: Oropharynx is clear.   Eyes:      Extraocular Movements: Extraocular movements intact.   Cardiovascular:      Rate and Rhythm: Normal rate and regular rhythm.      Pulses: Normal pulses.      Heart sounds: Normal  heart sounds.   Pulmonary:      Effort: Pulmonary effort is normal.      Breath sounds: Normal breath sounds.   Abdominal:      Palpations: Abdomen is soft.   Musculoskeletal:      Cervical back: Normal range of motion and neck supple.   Neurological:      Mental Status: She is alert.        LABS:   Patient will have labs  completed at [facility], per her request.     ASSESSMENT:    ICD-10-CM    1. Iron deficiency anemia  D50.9 ferrous sulfate (FEOSOL) 325 mg (65 mg iron) Oral Tablet     COMPREHENSIVE METABOLIC PANEL, NON-FASTING     CBC/DIFF     FERRITIN     IRON TRANSFERRIN AND TIBC      2. 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 Other cord compression (CMS HCC)  G95.29       4. Alcohol use, unspecified with intoxication delirium (CMS [place])  F10.921       5. Major depressive disorder, recurrent, moderate (CMS HCC)  F33.1       6. Chronic kidney disease, stage 3b (CMS HCC)  N18.32       7. Hx of AKA (above knee amputation), left (CMS HCC)  Z89.612       8. Morbid obesity (CMS HCC)  E66.01       9. Vitamin D deficiency  E55.9 cholecalciferol, vitamin D3, 25 mcg (1,000 unit) Oral Tablet     VITAMIN D 25 TOTAL             PLAN:   1. All relative external and internal medical records were reviewed including available H&Ps, progress notes, procedure notes, imaging's, laboratories, and pathology.   2. Patient will have labs completed at [facility] as ordered. Details of exam finding's discussed.       Abigail Carlson was given the chance to ask questions, and these were answered to their satisfaction. The patient is welcome to call with any questions or concerns in the meantime.     On the day of the encounter, a total of  56 minutes was spent on this patient encounter including review of historical information, examination, documentation and post-visit activities.   Return in about 3 months (around 05/09/2022).     Kathrin Penner, APRN,FNP-BC ,02/06/2022 ,10:54     CC:  Oliva Bustard,  NP  [address]  [place] [address]    No referring provider defined for this encounter.      This note was partially generated using MModal Fluency Direct system, and there may be some incorrect words, spellings, and punctuation that were not noted in checking the note before saving.

## 2022-05-03 ENCOUNTER — Encounter (INDEPENDENT_AMBULATORY_CARE_PROVIDER_SITE_OTHER): Payer: Self-pay | Admitting: Student in an Organized Health Care Education/Training Program

## 2022-05-04 ENCOUNTER — Encounter (INDEPENDENT_AMBULATORY_CARE_PROVIDER_SITE_OTHER): Payer: Commercial Managed Care - PPO | Admitting: Student in an Organized Health Care Education/Training Program

## 2022-05-08 ENCOUNTER — Ambulatory Visit (HOSPITAL_COMMUNITY): Payer: Self-pay | Admitting: NURSE PRACTITIONER

## 2022-06-14 ENCOUNTER — Ambulatory Visit (HOSPITAL_COMMUNITY): Payer: Self-pay | Admitting: NURSE PRACTITIONER

## 2022-07-03 ENCOUNTER — Encounter (INDEPENDENT_AMBULATORY_CARE_PROVIDER_SITE_OTHER): Payer: Commercial Managed Care - PPO | Admitting: Student in an Organized Health Care Education/Training Program

## 2022-07-17 ENCOUNTER — Ambulatory Visit: Payer: Commercial Managed Care - PPO | Attending: NURSE PRACTITIONER | Admitting: NURSE PRACTITIONER

## 2022-07-17 ENCOUNTER — Encounter (HOSPITAL_COMMUNITY): Payer: Self-pay | Admitting: NURSE PRACTITIONER

## 2022-07-17 ENCOUNTER — Other Ambulatory Visit: Payer: Self-pay

## 2022-07-17 ENCOUNTER — Other Ambulatory Visit (HOSPITAL_COMMUNITY): Payer: Commercial Managed Care - PPO

## 2022-07-17 VITALS — BP 114/72 | HR 90 | Temp 99.2°F | Ht 67.0 in

## 2022-07-17 DIAGNOSIS — Z89612 Acquired absence of left leg above knee: Secondary | ICD-10-CM | POA: Insufficient documentation

## 2022-07-17 DIAGNOSIS — D509 Iron deficiency anemia, unspecified: Secondary | ICD-10-CM

## 2022-07-17 DIAGNOSIS — E559 Vitamin D deficiency, unspecified: Secondary | ICD-10-CM

## 2022-07-17 DIAGNOSIS — R739 Hyperglycemia, unspecified: Secondary | ICD-10-CM

## 2022-07-17 DIAGNOSIS — E785 Hyperlipidemia, unspecified: Secondary | ICD-10-CM | POA: Insufficient documentation

## 2022-07-17 DIAGNOSIS — E039 Hypothyroidism, unspecified: Secondary | ICD-10-CM

## 2022-07-17 DIAGNOSIS — E538 Deficiency of other specified B group vitamins: Secondary | ICD-10-CM

## 2022-07-17 DIAGNOSIS — Z9884 Bariatric surgery status: Secondary | ICD-10-CM | POA: Insufficient documentation

## 2022-07-17 LAB — IRON TRANSFERRIN AND TIBC
IRON (TRANSFERRIN) SATURATION: 38 % (ref 15–50)
IRON: 95 ug/dL (ref 50–212)
TOTAL IRON BINDING CAPACITY: 252 ug/dL (ref 250–450)
TRANSFERRIN: 180 mg/dL — ABNORMAL LOW (ref 203–362)
UIBC: 157 ug/dL (ref 130–375)

## 2022-07-17 LAB — LIPID PANEL
CHOL/HDL RATIO: 1.7
CHOLESTEROL: 125 mg/dL (ref <200)
HDL CHOL: 73 mg/dL (ref 23–92)
LDL CALC: 41 mg/dL (ref 0–100)
TRIGLYCERIDES: 56 mg/dL (ref <=150)
VLDL CALC: 11 mg/dL (ref 0–50)

## 2022-07-17 LAB — COMPREHENSIVE METABOLIC PANEL, NON-FASTING
ALBUMIN/GLOBULIN RATIO: 1.5 — ABNORMAL HIGH (ref 0.8–1.4)
ALBUMIN: 3.2 g/dL — ABNORMAL LOW (ref 3.5–5.7)
ALKALINE PHOSPHATASE: 124 U/L — ABNORMAL HIGH (ref 34–104)
ALT (SGPT): 22 U/L (ref 7–52)
ANION GAP: 4 mmol/L (ref 4–13)
AST (SGOT): 36 U/L (ref 13–39)
BILIRUBIN TOTAL: 0.5 mg/dL (ref 0.3–1.2)
BUN/CREA RATIO: 13 (ref 6–22)
BUN: 14 mg/dL (ref 7–25)
CALCIUM, CORRECTED: 9 mg/dL (ref 8.9–10.8)
CALCIUM: 8.4 mg/dL — ABNORMAL LOW (ref 8.6–10.3)
CHLORIDE: 112 mmol/L — ABNORMAL HIGH (ref 98–107)
CO2 TOTAL: 27 mmol/L (ref 21–31)
CREATININE: 1.06 mg/dL (ref 0.60–1.30)
ESTIMATED GFR: 64 mL/min/{1.73_m2} (ref >59)
GLOBULIN: 2.1 — ABNORMAL LOW (ref 2.9–5.4)
GLUCOSE: 81 mg/dL (ref 74–109)
OSMOLALITY, CALCULATED: 285 mOsm/kg (ref 270–290)
POTASSIUM: 4.6 mmol/L (ref 3.5–5.1)
PROTEIN TOTAL: 5.3 g/dL — ABNORMAL LOW (ref 6.4–8.9)
SODIUM: 143 mmol/L (ref 136–145)

## 2022-07-17 LAB — HGA1C (HEMOGLOBIN A1C WITH EST AVG GLUCOSE): HEMOGLOBIN A1C: 4.7 % (ref 4.0–6.0)

## 2022-07-17 LAB — CBC WITH DIFF
BASOPHIL #: 0 10*3/uL (ref 0.00–0.10)
BASOPHIL %: 0 % (ref 0–1)
EOSINOPHIL #: 0.1 10*3/uL (ref 0.00–0.50)
EOSINOPHIL %: 1 %
HCT: 41.8 % (ref 31.2–41.9)
HGB: 14 g/dL (ref 10.9–14.3)
LYMPHOCYTE #: 0.9 10*3/uL — ABNORMAL LOW (ref 1.00–3.00)
LYMPHOCYTE %: 10 % — ABNORMAL LOW (ref 16–44)
MCH: 31.5 pg (ref 24.7–32.8)
MCHC: 33.5 g/dL (ref 32.3–35.6)
MCV: 94.1 fL (ref 75.5–95.3)
MONOCYTE #: 0.5 10*3/uL (ref 0.30–1.00)
MONOCYTE %: 6 % (ref 5–13)
MPV: 8 fL (ref 7.9–10.8)
NEUTROPHIL #: 7.6 10*3/uL (ref 1.85–7.80)
NEUTROPHIL %: 83 % — ABNORMAL HIGH (ref 43–77)
PLATELETS: 257 10*3/uL (ref 140–440)
RBC: 4.45 10*6/uL (ref 3.63–4.92)
RDW: 13.4 % (ref 12.3–17.7)
WBC: 9.2 10*3/uL (ref 3.8–11.8)

## 2022-07-17 LAB — MAGNESIUM: MAGNESIUM: 1.6 mg/dL — ABNORMAL LOW (ref 1.9–2.7)

## 2022-07-17 LAB — VITAMIN B12: VITAMIN B 12: 883 pg/mL (ref 180–914)

## 2022-07-17 LAB — FERRITIN: FERRITIN: 54 ng/mL (ref 11–336)

## 2022-07-17 LAB — VITAMIN D 25 TOTAL: VITAMIN D: 59 ng/mL (ref 30–100)

## 2022-07-17 LAB — THYROID STIMULATING HORMONE WITH FREE T4 REFLEX: TSH: 0.746 u[IU]/mL (ref 0.450–5.330)

## 2022-07-17 NOTE — Cancer Center Note (Signed)
Department of Hematology/Oncology  History and Physical    Name: Abigail Carlson  [id number]  Date of Birth: Jul 14, 1975  Encounter Date: 07/17/2022    REFERRING PROVIDER:  No referring provider defined for this encounter.    REASON FOR OFFICE VISIT:  Follow up for evaluation and management of  iron-deficiency anemia.    HISTORY OF PRESENT ILLNESS:  Abigail Carlson is a 47 y.o. female who presents today for iron-deficiency anemia.  She was initially seen at [facility] on October 08, 2013.  She was last seen on 07/11/2020 at [facility].  She has had Imferon in the past and tolerated it well.  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  The patient states that she follows up to get her prosthetic leg next week. She has been under a lot of stress due to losing her mother and father about a month apart. She states that she has someone to come in and help her with her ADL's.     ROS:   Review of Systems   Constitutional:  Positive for appetite change (decreased) and fatigue.   Gastrointestinal:  Positive for nausea.   Musculoskeletal:  Positive for gait problem (due to amputation).   Neurological:  Positive for gait problem (due to amputation).   Psychiatric/Behavioral:  Positive for depression.         History:  Past Medical History:   Diagnosis Date    AKA stump complication (CMS HCC)     Anemia     Anxiety state     Asthma     Carpal tunnel syndrome     Depression     Diabetes mellitus, type 2 (CMS HCC)     Esophageal reflux     Fibromyalgia     Hiatal hernia     History of IBS     Hypovitaminosis D     Kidney stones     Migraines     Osteoarthritis     PTSD (post-traumatic stress disorder)     RLS (restless legs syndrome)     SCC (squamous cell carcinoma)     Systemic lupus erythematosus (CMS HCC)      Past Surgical History:   Procedure Laterality Date     HX APPENDECTOMY      HX CERVICAL DISKECTOMY      HX CHOLECYSTECTOMY      HX GASTRIC BYPASS      HX TONSILLECTOMY      LAMINECTOMY      LEG AMPUTATION Left     REPLACEMENT TOTAL KNEE BILATERAL      SPINAL FUSION             Social History     Socioeconomic History    Marital status: Single     Spouse name: Not on file    Number of children: Not on file    Years of education: Not on file    Highest education level: Not on file   Occupational History    Not on file   Tobacco Use    Smoking status: Never    Smokeless tobacco: Never   Vaping Use    Vaping status: Never Used   Substance and Sexual Activity    Alcohol use: Never    Drug use: Never    Sexual activity: Not on file   Other Topics Concern  Not on file   Social History Narrative    Not on file     Social Determinants of Health     Financial Resource Strain: Not on file   Transportation Needs: Not on file   Social Connections: Not on file   Intimate Partner Violence: Not on file   Housing Stability: Not on file       Social History     Social History Narrative    Not on file       Social History     Substance and Sexual Activity   Drug Use Never       Family Medical History:       Problem Relation (Age of Onset)    Elevated Lipids Father    Heart Disease Father    Hypertension (High Blood Pressure) Mother    Kidney Cancer Mother    Migraines Mother              Current Outpatient Medications   Medication Sig    acetaminophen (TYLENOL) 500 mg Oral Tablet Take 2 Tablets (1,000 mg total) by mouth    albuterol sulfate (PROVENTIL OR VENTOLIN OR PROAIR) 90 mcg/actuation Inhalation oral inhaler Take 1-2 Puffs by inhalation Every 6 hours as needed    Alcohol Swabs Pads, Medicated Apply 1 Swab topically    ALPRAZolam (XANAX) 0.5 mg Oral Tablet Take 1 Tablet (0.5 mg total) by mouth Every night as needed for Insomnia    ascorbic acid, vitamin C, (VITAMIN C) 250 mg Oral Tablet Take 1 Tablet (250 mg total) by mouth    aspirin 81 mg Oral Tablet, Chewable Chew 1 Tablet  (81 mg total) Once a day    Blood Sugar Diagnostic Strip 1 Strip    buPROPion (WELLBUTRIN XL) 150 mg extended release 24 hr tablet Take 2 Tablets (300 mg total) by mouth    calcium carbonate-vitamin D3 500 mg-5 mcg (200 unit) Oral Tablet Take 1 Tablet by mouth    cholecalciferol, vitamin D3, 25 mcg (1,000 unit) Oral Tablet Take 1 Tablet (1,000 Units total) by mouth Once a day    clobetasoL (TEMOVATE) 0.05 % Cream APPLY TWICE A DAY TO RASH ON ARMS X 1 MONTH. DO NOT APPLY TO FACE.    cyanocobalamin (VITAMIN B12) 1,000 mcg/mL Injection Solution 1 mL (1,000 mcg total) Once a day    Dexlansoprazole (DEXILANT) 60 mg Oral Cap, Delayed Rel., Multiphasic Take 1 Capsule (60 mg total) by mouth Once a day    Diaper,Brief, Adult,Disposable Misc     diclofenac sodium (VOLTAREN) 1 % Gel APPLY TO AFFECTED AREA TWICE DAILY AS NEEDED    diphenoxylate-atropine (LOMOTIL) 2.5-0.025 mg Oral Tablet     ferrous sulfate (FEOSOL) 325 mg (65 mg iron) Oral Tablet Take 1 Tablet (325 mg total) by mouth Every other day    gabapentin (NEURONTIN) 600 mg Oral Tablet Take 1 Tablet (600 mg total) by mouth Three times a day    hydrocortisone 2.5 % Cream two times daily .    hydroxychloroquine (PLAQUENIL) 200 mg Oral Tablet Take 1 Tablet (200 mg total) by mouth Once a day    Levetiracetam 500 mg Oral Tablet Sustained Release 24 hr Take 1 Tablet (500 mg total) by mouth Every night    medroxyPROGESTERone (DEPO-PROVERA) 150 mg/mL IntraMUSCULAR Syringe Inject 1 mL (150 mg total) into the muscle    melatonin 3 mg Oral Tablet Take 1 Tablet (3 mg total) by mouth    metFORMIN (GLUCOPHAGE) 500 mg Oral  Tablet     methocarbamoL (ROBAXIN) 500 mg Oral Tablet TAKE 1 TABLET FOUR TIMES DAILY AS NEEDED BY MOUTH FOR MUSCLE SPASMS OR PAIN.    metoprolol tartrate (LOPRESSOR) 25 mg Oral Tablet Take 1 Tablet (25 mg total) by mouth    mirabegron (MYRBETRIQ) 50 mg Oral Tablet Sustained Release 24 hr Take 1 Tablet (50 mg total) by mouth Once a day    multivitamin with minerals  Oral Tablet Take 1 Tablet by mouth    mupirocin (BACTROBAN) 2 % Ointment Apply 1 Application topically    naloxone (NARCAN) 4 mg per spray nasal spray INHALE 1 SPRAY (4 MG) INTO NOSTRIL ONE TIME IF NEEDED    naproxen sodium (ANAPROX) 550 mg Oral Tablet Take 1 Tablet (550 mg total) by mouth    nitroGLYCERIN (NITROSTAT) 0.4 mg Sublingual Tablet, Sublingual PLACE 1 TABLET UNDER TONGUE EVERY 5 MINS, UP TO 3 DOSES AS NEEDED FOR CHEST PAIN    nystatin (NYSTOP) 100,000 unit/gram Powder Apply topically    ondansetron (ZOFRAN) 8 mg Oral Tablet     Oxycodone (ROXICODONE) 10 mg Oral Tablet Take 1 Tablet (10 mg total) by mouth    OZEMPIC 0.25 mg or 0.5 mg (2 mg/3 mL) Subcutaneous Pen Injector INJECT 0.5 MG SUBCUTANEOUSLY WEEKLY    Pen Needle, Disposable, 31 gauge x 3/16" Needle Inject 1 Each under the skin    phentermine (ADIPEX-P) 37.5 mg Oral Tablet Take 1 Tablet (37.5 mg total) by mouth    predniSONE (DELTASONE) 10 mg Oral Tablet TAKE 1 TABLET BY MOUTH TWICE A DAY AS NEEDED FOR LUPUS FLARE    prochlorperazine (COMPAZINE) 10 mg Oral Tablet Take 1 Tablet (10 mg total) by mouth    RESTASIS MULTIDOSE 0.05 % Ophthalmic Drops USE 1 DROP IN EACH EYE TWICE A DAY    riboflavin, vitamin B2, (VITAMIN B-2) 100 mg Oral Tablet Take 1 Tablet (100 mg total) by mouth    rizatriptan (MAXALT) 10 mg Oral Tablet TAKE 1 TABLET AS NEEDED ON ONSET OF HEADACHE, MAY REPEAT AFTER 2 HOURS UP TO 2 IN 24 HOURS    rOPINIRole (REQUIP) 1 mg Oral Tablet Take 1-2 Tablets (1-2 mg total) by mouth    solifenacin (VESICARE) 10 mg Oral Tablet Take 1 Tablet (10 mg total) by mouth Once a day    SYMBICORT 160-4.5 mcg/actuation Inhalation oral inhaler 2 Puffs Twice daily    TRINTELLIX 10 mg Oral Tablet TAKE 1 TABLET (10 MG) BY MOUTH DAILY.    TYMLOS 80 mcg (3,120 mcg/1.56 mL) Subcutaneous Pen Injector Inject 80 mcg under the skin    vitamin A (AQUASOL A) 3,000 mcg (10,000 unit) Oral Capsule Take 1 Capsule (10,000 Units total) by mouth Once a day 1 a day    vitamin E  400 unit Oral Capsule Take 1 Capsule (400 Units total) by mouth Once a day       Allergies   Allergen Reactions    Augmentin [Amoxicillin-Pot Clavulanate]     Azithromycin     Celebrex [Celecoxib]     Cephalosporins     Chlorhexidine Towelette     Clavulanic Acid     Codeine     Cymbalta [Duloxetine]     Penicillins     Tramadol          PHYSICAL EXAM:  BP 114/72 (Site: Right, Patient Position: Sitting, Cuff Size: Adult)   Pulse 90   Temp 37.3 C (99.2 F) (Temporal)   Ht 1.702 m (5\' 7" )   BMI  33.99 kg/m      ECOG Status: (3) Capable of limited self-care, confined to bed or chair > 50% of waking hours   Physical Exam  HENT:      Mouth/Throat:      Mouth: Mucous membranes are moist.      Pharynx: Oropharynx is clear.   Eyes:      Extraocular Movements: Extraocular movements intact.   Cardiovascular:      Rate and Rhythm: Normal rate and regular rhythm.      Pulses: Normal pulses.      Heart sounds: Normal heart sounds.   Pulmonary:      Effort: Pulmonary effort is normal.      Breath sounds: Normal breath sounds.   Abdominal:      Palpations: Abdomen is soft.   Musculoskeletal:      Cervical back: Normal range of motion and neck supple.   Neurological:      Mental Status: She is alert.        LABS:             ASSESSMENT:    ICD-10-CM    1. Iron deficiency anemia  D50.9 COMPREHENSIVE METABOLIC PANEL, NON-FASTING     CBC/DIFF     FERRITIN     IRON TRANSFERRIN AND TIBC      2. Vitamin D deficiency  E55.9 VITAMIN D 25 TOTAL      3. Hx of AKA (above knee amputation), left (CMS HCC)  Z89.612       4. Vitamin B 12 deficiency  E53.8 VITAMIN B12      5. Hypomagnesemia  E83.42 MAGNESIUM      6. Hyperglycemia  R73.9 HGA1C (HEMOGLOBIN A1C WITH EST AVG GLUCOSE)      7. Hyperlipidemia, unspecified hyperlipidemia type  E78.5 LIPID PANEL      8. Hypothyroidism, unspecified type  E03.9 THYROID STIMULATING HORMONE WITH FREE T4 REFLEX             PLAN:   Patient will have labs completed today as ordered. She will continue her  current medications as prescribed and follow up with all of her other providers as scheduled. She will follow up with [place] in 3 months or sooner if needed.       Abigail Carlson was given the chance to ask questions, and these were answered to their satisfaction. The patient is welcome to call with any questions or concerns in the meantime.     On the day of the encounter, a total of  30 minutes was spent on this patient encounter including review of historical information, examination, documentation and post-visit activities.   Return in about 3 months (around 10/16/2022).     Kathrin Penner, APRN,FNP-BC ,07/17/2022 ,14:11     CC:  Oliva Bustard, NP  [phone]  [place] [address]    No referring provider defined for this encounter.      This note was partially generated using MModal Fluency Direct system, and there may be some incorrect words, spellings, and punctuation that were not noted in checking the note before saving.

## 2022-08-30 NOTE — Telephone Encounter (Signed)
Candace with [facility] calling. Patient has several questions about proceeding with back surgery.   Advised patient can call our office or make an appointment with Dr. Terrilee Croak to have questions answered.    Candace states she will advise patient of above.    Darius Bump, RN 08/30/2022 11:45 AM

## 2022-08-30 NOTE — Progress Notes (Signed)
Patient identification was verified by name and date of birth. Allergies and medication list reviewed and updated with patient. A print out of the current list will be provided to the patient with the AVS at check out. Verified patient pharmacy preference correct on chart.

## 2022-09-05 ENCOUNTER — Ambulatory Visit (INDEPENDENT_AMBULATORY_CARE_PROVIDER_SITE_OTHER): Payer: Commercial Managed Care - PPO | Admitting: Physician Assistant

## 2022-09-05 ENCOUNTER — Other Ambulatory Visit: Payer: Self-pay

## 2022-09-05 VITALS — BP 119/74 | HR 81 | Ht 67.0 in | Wt 199.0 lb

## 2022-09-05 DIAGNOSIS — Z87442 Personal history of urinary calculi: Secondary | ICD-10-CM

## 2022-09-05 DIAGNOSIS — N3941 Urge incontinence: Secondary | ICD-10-CM

## 2022-09-05 DIAGNOSIS — N393 Stress incontinence (female) (male): Secondary | ICD-10-CM

## 2022-09-05 DIAGNOSIS — N3281 Overactive bladder: Secondary | ICD-10-CM

## 2022-09-05 MED ORDER — MIRABEGRON ER 50 MG TABLET,EXTENDED RELEASE 24 HR
1.0000 | ORAL_TABLET | Freq: Every day | ORAL | 3 refills | Status: DC
Start: 2022-09-05 — End: 2023-03-11

## 2022-09-05 MED ORDER — SOLIFENACIN 10 MG TABLET
10.0000 mg | ORAL_TABLET | Freq: Every day | ORAL | 3 refills | Status: DC
Start: 2022-09-05 — End: 2023-03-11

## 2022-09-05 NOTE — Progress Notes (Signed)
[facility]  UROLOGY, [facility]    Progress Note    Name: Abigail Carlson MRN:  [id number]   Date: 09/05/2022 Age: 47 y.o.       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  She is on Myrbetriq 50 mg for her overactive bladder in his happy with the results.  She has to wear 0-1 pads per day.  She does not have significant bother from her incontinence.  She reports a history of kidney stones. She denies fevers, chills, nausea, vomiting, hematuria, dysuria, flank pain, incontinence, dribbling, hesitancy, suprapubic pain, headaches, vision changes, shortness of breath, chest pain.    Patient reports nocturia q2h . Patient reports continues to experience symptom control during the day, but still experiences some leaking requiring 0-1 pads daily, but no improvement at night using up to 10 pads. Patient reports she takes both Myrbetriq and Vesicare in the morning. Patient denies fevers, chills, nausea, vomiting, hematuria, dysuria, flank pain, incontinence, dribbling, hesitancy, suprapubic pain, headaches, vision changes, shortness of breath, chest pain.       Objective :  BP 119/74 (Site: Left, Patient Position: Sitting, Cuff Size: Adult)   Pulse 81   Ht 1.702 m (5\' 7" )   Wt 90.3 kg (199 lb)   BMI 31.17 kg/m       Gen: NAD, alert  Pulm: unlabored at rest  CV: palpable pulses  Abd: soft, Nt/ND  GU: no suprapubic tenderness, no CVAT    Data reviewed:    Current Outpatient Medications   Medication Sig    acetaminophen (TYLENOL) 500 mg Oral Tablet Take 2 Tablets (1,000 mg total) by mouth    albuterol sulfate (PROVENTIL OR VENTOLIN OR PROAIR) 90 mcg/actuation Inhalation oral inhaler Take 1-2 Puffs by inhalation Every 6 hours as needed    Alcohol Swabs Pads, Medicated Apply 1 Swab topically    ALPRAZolam (XANAX) 0.5 mg Oral Tablet Take 1  Tablet (0.5 mg total) by mouth Every night as needed for Insomnia    ascorbic acid, vitamin C, (VITAMIN C) 250 mg Oral Tablet Take 1 Tablet (250 mg total) by mouth    aspirin 81 mg Oral Tablet, Chewable Chew 1 Tablet (81 mg total) Once a day    Blood Sugar Diagnostic Strip 1 Strip    buPROPion (WELLBUTRIN XL) 150 mg extended release 24 hr tablet Take 2 Tablets (300 mg total) by mouth    calcium carbonate-vitamin D3 500 mg-5 mcg (200 unit) Oral Tablet Take 1 Tablet by mouth    cholecalciferol, vitamin D3, 25 mcg (1,000 unit) Oral Tablet Take 1 Tablet (1,000 Units total) by mouth Once a day    clobetasoL (TEMOVATE) 0.05 % Cream APPLY TWICE A DAY TO RASH ON ARMS X 1 MONTH. DO NOT APPLY TO FACE.    cyanocobalamin (VITAMIN B12) 1,000 mcg/mL Injection Solution 1 mL (1,000 mcg total) Once a day    Dexlansoprazole (DEXILANT) 60 mg Oral Cap, Delayed Rel., Multiphasic Take 1 Capsule (60 mg total) by mouth Once a day    Diaper,Brief, Adult,Disposable Misc     diclofenac sodium (VOLTAREN) 1 % Gel APPLY TO AFFECTED AREA TWICE DAILY AS NEEDED    diphenoxylate-atropine (LOMOTIL) 2.5-0.025 mg Oral Tablet     ferrous sulfate (FEOSOL) 325 mg (65 mg iron) Oral Tablet Take 1  Tablet (325 mg total) by mouth Every other day    gabapentin (NEURONTIN) 600 mg Oral Tablet Take 1 Tablet (600 mg total) by mouth Three times a day    hydrocortisone 2.5 % Cream two times daily .    hydroxychloroquine (PLAQUENIL) 200 mg Oral Tablet Take 1 Tablet (200 mg total) by mouth Once a day    Levetiracetam 500 mg Oral Tablet Sustained Release 24 hr Take 1 Tablet (500 mg total) by mouth Every night    medroxyPROGESTERone (DEPO-PROVERA) 150 mg/mL IntraMUSCULAR Syringe Inject 1 mL (150 mg total) into the muscle    melatonin 3 mg Oral Tablet Take 1 Tablet (3 mg total) by mouth    metFORMIN (GLUCOPHAGE) 500 mg Oral Tablet     methocarbamoL (ROBAXIN) 500 mg Oral Tablet TAKE 1 TABLET FOUR TIMES DAILY AS NEEDED BY MOUTH FOR MUSCLE SPASMS OR PAIN.    metoprolol  tartrate (LOPRESSOR) 25 mg Oral Tablet Take 1 Tablet (25 mg total) by mouth    mirabegron (MYRBETRIQ) 50 mg Oral Tablet Sustained Release 24 hr Take 1 Tablet (50 mg total) by mouth Once a day    multivitamin with minerals Oral Tablet Take 1 Tablet by mouth    mupirocin (BACTROBAN) 2 % Ointment Apply 1 Application topically    naloxone (NARCAN) 4 mg per spray nasal spray INHALE 1 SPRAY (4 MG) INTO NOSTRIL ONE TIME IF NEEDED    naproxen sodium (ANAPROX) 550 mg Oral Tablet Take 1 Tablet (550 mg total) by mouth    nitroGLYCERIN (NITROSTAT) 0.4 mg Sublingual Tablet, Sublingual PLACE 1 TABLET UNDER TONGUE EVERY 5 MINS, UP TO 3 DOSES AS NEEDED FOR CHEST PAIN    nystatin (NYSTOP) 100,000 unit/gram Powder Apply topically    ondansetron (ZOFRAN) 8 mg Oral Tablet     Oxycodone (ROXICODONE) 10 mg Oral Tablet Take 1 Tablet (10 mg total) by mouth    OZEMPIC 0.25 mg or 0.5 mg (2 mg/3 mL) Subcutaneous Pen Injector INJECT 0.5 MG SUBCUTANEOUSLY WEEKLY    Pen Needle, Disposable, 31 gauge x 3/16" Needle Inject 1 Each under the skin    phentermine (ADIPEX-P) 37.5 mg Oral Tablet Take 1 Tablet (37.5 mg total) by mouth    predniSONE (DELTASONE) 10 mg Oral Tablet TAKE 1 TABLET BY MOUTH TWICE A DAY AS NEEDED FOR LUPUS FLARE    prochlorperazine (COMPAZINE) 10 mg Oral Tablet Take 1 Tablet (10 mg total) by mouth    RESTASIS MULTIDOSE 0.05 % Ophthalmic Drops USE 1 DROP IN EACH EYE TWICE A DAY    riboflavin, vitamin B2, (VITAMIN B-2) 100 mg Oral Tablet Take 1 Tablet (100 mg total) by mouth    rizatriptan (MAXALT) 10 mg Oral Tablet TAKE 1 TABLET AS NEEDED ON ONSET OF HEADACHE, MAY REPEAT AFTER 2 HOURS UP TO 2 IN 24 HOURS    rOPINIRole (REQUIP) 1 mg Oral Tablet Take 1-2 Tablets (1-2 mg total) by mouth    solifenacin (VESICARE) 10 mg Oral Tablet Take 1 Tablet (10 mg total) by mouth Once a day    SYMBICORT 160-4.5 mcg/actuation Inhalation oral inhaler 2 Puffs Twice daily    TRINTELLIX 10 mg Oral Tablet TAKE 1 TABLET (10 MG) BY MOUTH DAILY.    TYMLOS  80 mcg (3,120 mcg/1.56 mL) Subcutaneous Pen Injector Inject 80 mcg under the skin    vitamin A (AQUASOL A) 3,000 mcg (10,000 unit) Oral Capsule Take 1 Capsule (10,000 Units total) by mouth Once a day 1 a day    vitamin E  400 unit Oral Capsule Take 1 Capsule (400 Units total) by mouth Once a day     Assessment/Plan  Problem List Items Addressed This Visit    None    Overactive bladder with Urge urinary incontinence on myrebtriq 50 mg  I discussed the differential diagnosis, pathophysiology and nature of patient's overactive bladder/urge urinary incontinence  I also counseled patient on conservative management options including appropriate fluid management, avoidance of diuretics including caffeine and alcohol, weight loss (if applicable), dedicated pelvic floor muscle therapy and Kegel's exercises  Additionally, we discussed the role of pharmacotherapy, including risks, benefits and alternatives:  Anticholinergic therapy (e.g. Oxybutynin, Tolterodine, Solifenacin, etc)- discussed potential risks of dry mouth, dry eyes, constipation, impaired cognition, prolonged Q-T interval and urinary retention  Beta-3 agonist therapy (e.g. Mirabegron) - discussed potential risks of hypertension, nasopharyngitis, urinary tract infection and headache  Refill provided for Mirabegron (MyrbetriqT) 50 mg P.O. daily:    I have discussed in great detail with the patient the treatment of urge incontinence/overactive bladder symptoms using mirabegron .  I have explained my rationale for using mirabegron as well as potential risks of hypertension (11.3%), nasopharyngitis (3.9%), urinary tract infection (4.2%), and tachycardia (1.6%). Patient was also cautioned that full therapeutic efficacy may take up to twelve weeks.  Patient expressed an understanding of the treatment, possible reactions, and possible prognosis.  REfill Solifenacin succinate (VESIcareT) 10 mg P.O. daily:    I have discussed in great detail with the patient the treatment  of urge incontinence/overactive bladder symptoms using solifenacin.  I have explained my rationale for using solifenacin as well as potential dose-dependent risks of dry mouth (10.9-27.6%), constipation (5.4-13.4%), and blurry vision (3.8-4.8%).  Patient was also cautioned that full therapeutic efficacy may take up to twelve weeks.  Patient expressed an understanding of the treatment, possible reactions, and possible prognosis.       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  Given the bothersome nature of her symptoms, we discussed potential treatment options including:  Conservative management, including pelvic floor "Kegel" exercises, which is generally reserved for mild cases  Estrogen replacement therapy either primarily or as an adjunct, when not contraindicated, to restore blood flow to the female pelvic organs to aid in preserving vascularity to her pelvic floor  Urethral bulking agents  Synthetic midurethral sling delivered either a retropubic or transobturator approach  Bladder neck sling utilizing autologous fascia  We also discussed the incidence, nature and risks of de novo urge urinary incontinence following treatment of stress urinary incontinence, particularly following midurethral sling  The patient has agreed to undergo cystoscopy with Retropubic midurethral sling and understands that the risks include but are not limited to bleeding, infection, damage to adjacent tissues, therapeutic failure, injury/erosion into bladder/bowel/urethra/ureters, postoperative pain, possibly long term, possible need for post-operative drains/stents, need for additional procedures.  Additionally, we discussed the 2011 FDA Public Health Notification on "Serious Complications Associated with Transvaginal Placement of Surgical Mesh in Repair of ... Stress Urinary Incontinence"  including complete failure, de novo pelvic organ prolapse, foreign body reaction, fistula formation, infection, graft erosion/extrusion/migration, nerve damage, pain, urinary tract obstruction, urinary retention, vaginal contracture, wound dehiscence and sexual dysfunction.      History of nephrolithiasis  I discussed the differential diagnosis, pathophysiology and nature of urolithiasis and recurrent stone disease.  We also reviewed the recent 2014 American Urological Association guideline on "Medical Management of  Kidney Stones" and specifically discussed dietary therapies including:  Increase fluid intake to achieve urine output of at least 2.5 liters daily  Limit sodium intake to no more than 100 mEq (2,300 mg) per day  Consume 1,000-1,200 mg of dietary calcium per day  Limit oxalate-rich foods (beets, spinach, rhubarb, nuts, chocolate)  Limit non-dairy animal protein  Encouraged incresed fruit and vegetable intake

## 2022-09-18 ENCOUNTER — Other Ambulatory Visit (HOSPITAL_COMMUNITY): Payer: Self-pay | Admitting: NURSE PRACTITIONER

## 2022-09-18 DIAGNOSIS — D509 Iron deficiency anemia, unspecified: Secondary | ICD-10-CM

## 2022-10-23 ENCOUNTER — Encounter (INDEPENDENT_AMBULATORY_CARE_PROVIDER_SITE_OTHER): Payer: Self-pay | Admitting: NURSE PRACTITIONER

## 2022-10-23 ENCOUNTER — Ambulatory Visit: Payer: Commercial Managed Care - PPO | Attending: NURSE PRACTITIONER | Admitting: NURSE PRACTITIONER

## 2022-10-23 ENCOUNTER — Other Ambulatory Visit: Payer: Self-pay

## 2022-10-23 ENCOUNTER — Inpatient Hospital Stay (INDEPENDENT_AMBULATORY_CARE_PROVIDER_SITE_OTHER)
Admission: RE | Admit: 2022-10-23 | Discharge: 2022-10-23 | Disposition: A | Discharge status: Home / Self Care | Payer: Commercial Managed Care - PPO | Source: Ambulatory Visit | Attending: NURSE PRACTITIONER | Admitting: NURSE PRACTITIONER

## 2022-10-23 VITALS — BP 132/84 | HR 80 | Temp 98.0°F | Ht 67.0 in | Wt 199.0 lb

## 2022-10-23 DIAGNOSIS — F32A Depression, unspecified: Secondary | ICD-10-CM | POA: Insufficient documentation

## 2022-10-23 DIAGNOSIS — D509 Iron deficiency anemia, unspecified: Secondary | ICD-10-CM | POA: Insufficient documentation

## 2022-10-23 DIAGNOSIS — R5383 Other fatigue: Secondary | ICD-10-CM | POA: Insufficient documentation

## 2022-10-23 LAB — MANUAL DIFFERENTIAL
EOSINOPHIL %: 1 % (ref 0–7)
EOSINOPHIL ABSOLUTE: 0.06 10*3/uL (ref 0.00–0.80)
EOSINOPHILS MANUAL: 1
LYMPHOCYTE %: 13 % — ABNORMAL LOW (ref 25–45)
LYMPHOCYTE ABSOLUTE: 0.82 10*3/uL — ABNORMAL LOW (ref 1.10–5.00)
LYMPHOCYTES MANUAL: 13
MONOCYTE %: 8 % (ref 0–12)
MONOCYTE ABSOLUTE: 0.5 10*3/uL (ref 0.00–1.30)
MONOCYTES MANUAL: 8
NEUTROPHIL %: 78 % — ABNORMAL HIGH (ref 40–76)
NEUTROPHIL ABSOLUTE: 4.91 10*3/uL (ref 1.80–8.40)
NEUTROPHILS MANUAL: 78
PLATELET MORPHOLOGY COMMENT: NORMAL
RBC MORPHOLOGY COMMENT: NORMAL
TOTAL CELLS COUNTED [#] IN BLOOD: 100
WBC: 6.3 10*3/uL

## 2022-10-23 LAB — CBC WITH DIFF
HCT: 42.3 % — ABNORMAL HIGH (ref 31.2–41.9)
HGB: 14.2 g/dL (ref 10.9–14.3)
MCH: 31.3 pg (ref 24.7–32.8)
MCHC: 33.5 g/dL (ref 32.3–35.6)
MCV: 93.7 fL (ref 75.5–95.3)
MPV: 8.2 fL (ref 7.9–10.8)
PLATELETS: 264 10*3/uL (ref 140–440)
RBC: 4.52 10*6/uL (ref 3.63–4.92)
RDW: 12.9 % (ref 12.3–17.7)
WBC: 6.3 10*3/uL (ref 3.8–11.8)

## 2022-10-23 LAB — COMPREHENSIVE METABOLIC PANEL, NON-FASTING
ALBUMIN/GLOBULIN RATIO: 1.4 (ref 0.8–1.4)
ALBUMIN: 3 g/dL — ABNORMAL LOW (ref 3.5–5.7)
ALKALINE PHOSPHATASE: 98 U/L (ref 34–104)
ALT (SGPT): 19 U/L (ref 7–52)
ANION GAP: 7 mmol/L (ref 4–13)
AST (SGOT): 24 U/L (ref 13–39)
BILIRUBIN TOTAL: 0.5 mg/dL (ref 0.3–1.2)
BUN/CREA RATIO: 17 (ref 6–22)
BUN: 18 mg/dL (ref 7–25)
CALCIUM, CORRECTED: 9.5 mg/dL (ref 8.9–10.8)
CALCIUM: 8.7 mg/dL (ref 8.6–10.3)
CHLORIDE: 109 mmol/L — ABNORMAL HIGH (ref 98–107)
CO2 TOTAL: 28 mmol/L (ref 21–31)
CREATININE: 1.09 mg/dL (ref 0.60–1.30)
ESTIMATED GFR: 62 mL/min/{1.73_m2} (ref >59)
GLOBULIN: 2.2 — ABNORMAL LOW (ref 2.9–5.4)
GLUCOSE: 70 mg/dL — ABNORMAL LOW (ref 74–109)
OSMOLALITY, CALCULATED: 287 mOsm/kg (ref 270–290)
POTASSIUM: 4.2 mmol/L (ref 3.5–5.1)
PROTEIN TOTAL: 5.2 g/dL — ABNORMAL LOW (ref 6.4–8.9)
SODIUM: 144 mmol/L (ref 136–145)

## 2022-10-23 LAB — IRON TRANSFERRIN AND TIBC
IRON (TRANSFERRIN) SATURATION: 48 % (ref 15–50)
IRON: 118 ug/dL (ref 50–212)
TOTAL IRON BINDING CAPACITY: 246 ug/dL — ABNORMAL LOW (ref 250–450)
TRANSFERRIN: 176 mg/dL — ABNORMAL LOW (ref 203–362)
UIBC: 128 ug/dL — ABNORMAL LOW (ref 130–375)

## 2022-10-23 LAB — FERRITIN: FERRITIN: 56 ng/mL (ref 11–336)

## 2022-10-23 NOTE — Cancer Center Note (Signed)
Department of Hematology/Oncology  History and Physical    Name: Abigail Carlson  ZOX:[id number]  Date of Birth: 1975/12/09  Encounter Date: 10/23/2022    REFERRING PROVIDER:  Norval Gable, NP  [address]  [id number]  [place],  [place] [address]    REASON FOR OFFICE VISIT:  Follow up for evaluation and management of  iron-deficiency anemia.    HISTORY OF PRESENT ILLNESS:  Abigail Carlson is a 47 y.o. female who presents today for iron-deficiency anemia.  She was initially seen at [facility] on October 08, 2013.  She was last seen on 07/11/2020 at [facility].  She has had Imferon in the past and tolerated it well.  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  The patient states that she follows up to get her prosthetic leg next week. She has been under a lot of stress due to losing her mother and father about a month apart. She is doing fair. She is craving ice and feeling fatigued but is unsure if it is from iron deficiency or from her depression.     ROS:   Review of Systems   Constitutional:  Positive for appetite change (decreased) and fatigue.   Gastrointestinal:  Positive for nausea.   Musculoskeletal:  Positive for gait problem (due to amputation).   Neurological:  Positive for gait problem (due to amputation).   Psychiatric/Behavioral:  Positive for depression.         History:  Past Medical History:   Diagnosis Date    AKA stump complication (CMS HCC)     Anemia     Anxiety state     Asthma     Carpal tunnel syndrome     Depression     Diabetes mellitus, type 2 (CMS HCC)     Esophageal reflux     Fibromyalgia     Hiatal hernia     History of IBS     Hypovitaminosis D     Kidney stones     Migraines     Osteoarthritis     PTSD (post-traumatic stress disorder)     RLS (restless legs syndrome)     SCC (squamous cell carcinoma)     Systemic  lupus erythematosus (CMS HCC)      Past Surgical History:   Procedure Laterality Date    HX APPENDECTOMY      HX CERVICAL DISKECTOMY      HX CHOLECYSTECTOMY      HX GASTRIC BYPASS      HX TONSILLECTOMY      LAMINECTOMY      LEG AMPUTATION Left     REPLACEMENT TOTAL KNEE BILATERAL      SPINAL FUSION       Social History     Socioeconomic History    Marital status: Single     Spouse name: Not on file    Number of children: Not on file    Years of education: Not on file    Highest education level: Not on file   Occupational History    Not on file   Tobacco Use    Smoking status: Never    Smokeless tobacco: Never   Vaping Use    Vaping status: Never Used   Substance and Sexual Activity    Alcohol use: Never    Drug use: Never  Sexual activity: Not on file   Other Topics Concern    Not on file   Social History Narrative    Not on file     Social Determinants of Health     Financial Resource Strain: Not on file   Transportation Needs: Not on file   Social Connections: Not on file   Intimate Partner Violence: Not on file   Housing Stability: Not on file     Social History     Social History Narrative    Not on file     Social History     Substance and Sexual Activity   Drug Use Never     Family Medical History:       Problem Relation (Age of Onset)    Elevated Lipids Father    Heart Disease Father    Hypertension (High Blood Pressure) Mother    Kidney Cancer Mother    Migraines Mother          Current Outpatient Medications   Medication Sig    acetaminophen (TYLENOL) 500 mg Oral Tablet Take 2 Tablets (1,000 mg total) by mouth    albuterol sulfate (PROVENTIL OR VENTOLIN OR PROAIR) 90 mcg/actuation Inhalation oral inhaler Take 1-2 Puffs by inhalation Every 6 hours as needed    Alcohol Swabs Pads, Medicated Apply 1 Swab topically    ALPRAZolam (XANAX) 0.5 mg Oral Tablet Take 1 Tablet (0.5 mg total) by mouth Every night as needed for Insomnia    ascorbic acid, vitamin C, (VITAMIN C) 250 mg Oral Tablet Take 1 Tablet (250 mg  total) by mouth    aspirin 81 mg Oral Tablet, Chewable Chew 1 Tablet (81 mg total) Once a day    Blood Sugar Diagnostic Strip 1 Strip    buPROPion (WELLBUTRIN XL) 150 mg extended release 24 hr tablet Take 2 Tablets (300 mg total) by mouth    calcium carbonate-vitamin D3 500 mg-5 mcg (200 unit) Oral Tablet Take 1 Tablet by mouth    cholecalciferol, vitamin D3, 25 mcg (1,000 unit) Oral Tablet Take 1 Tablet (1,000 Units total) by mouth Once a day    citalopram (CELEXA) 20 mg Oral Tablet Take 1 Tablet (20 mg total) by mouth Once a day    clobetasoL (TEMOVATE) 0.05 % Cream APPLY TWICE A DAY TO RASH ON ARMS X 1 MONTH. DO NOT APPLY TO FACE.    cyanocobalamin (VITAMIN B12) 1,000 mcg/mL Injection Solution 1 mL (1,000 mcg total) Once a day    Dexlansoprazole (DEXILANT) 60 mg Oral Cap, Delayed Rel., Multiphasic Take 1 Capsule (60 mg total) by mouth Once a day    Diaper,Brief, Adult,Disposable Misc     diclofenac sodium (VOLTAREN) 1 % Gel APPLY TO AFFECTED AREA TWICE DAILY AS NEEDED    diphenoxylate-atropine (LOMOTIL) 2.5-0.025 mg Oral Tablet     ergocalciferol, vitamin D2, (DRISDOL) 1,250 mcg (50,000 unit) Oral Capsule TAKE 1 CAPSULE BY MOUTH 2 TIMES WEEKLY    ferrous sulfate (FEOSOL) 325 mg (65 mg iron) Oral Tablet TAKE 1 TABLET BY MOUTH EVERY OTHER DAY    gabapentin (NEURONTIN) 600 mg Oral Tablet Take 1 Tablet (600 mg total) by mouth Three times a day    hydrocortisone 2.5 % Cream two times daily .    hydroxychloroquine (PLAQUENIL) 200 mg Oral Tablet Take 1 Tablet (200 mg total) by mouth Once a day    Levetiracetam 500 mg Oral Tablet Sustained Release 24 hr Take 1 Tablet (500 mg total) by mouth Every night  magnesium oxide (MAG-OX) 400 mg Oral Tablet Take 1 Tablet (400 mg total) by mouth Once a day    medroxyPROGESTERone (DEPO-PROVERA) 150 mg/mL IntraMUSCULAR Syringe Inject 1 mL (150 mg total) into the muscle    melatonin 3 mg Oral Tablet Take 1 Tablet (3 mg total) by mouth    metFORMIN (GLUCOPHAGE) 500 mg Oral Tablet      methocarbamoL (ROBAXIN) 500 mg Oral Tablet TAKE 1 TABLET FOUR TIMES DAILY AS NEEDED BY MOUTH FOR MUSCLE SPASMS OR PAIN.    metoprolol tartrate (LOPRESSOR) 25 mg Oral Tablet Take 1 Tablet (25 mg total) by mouth    mirabegron (MYRBETRIQ) 50 mg Oral Tablet Sustained Release 24 hr Take 1 Tablet (50 mg total) by mouth Once a day    multivitamin with minerals Oral Tablet Take 1 Tablet by mouth    mupirocin (BACTROBAN) 2 % Ointment Apply 1 Application topically    naloxone (NARCAN) 4 mg per spray nasal spray INHALE 1 SPRAY (4 MG) INTO NOSTRIL ONE TIME IF NEEDED    naproxen sodium (ANAPROX) 550 mg Oral Tablet Take 1 Tablet (550 mg total) by mouth    nitroGLYCERIN (NITROSTAT) 0.4 mg Sublingual Tablet, Sublingual PLACE 1 TABLET UNDER TONGUE EVERY 5 MINS, UP TO 3 DOSES AS NEEDED FOR CHEST PAIN    nystatin (NYSTOP) 100,000 unit/gram Powder Apply topically    ondansetron (ZOFRAN) 8 mg Oral Tablet     Oxycodone (ROXICODONE) 10 mg Oral Tablet Take 1 Tablet (10 mg total) by mouth    OZEMPIC 0.25 mg or 0.5 mg (2 mg/3 mL) Subcutaneous Pen Injector INJECT 0.5 MG SUBCUTANEOUSLY WEEKLY    Pen Needle, Disposable, 31 gauge x 3/16" Needle Inject 1 Each under the skin    phentermine (ADIPEX-P) 37.5 mg Oral Tablet Take 1 Tablet (37.5 mg total) by mouth    predniSONE (DELTASONE) 10 mg Oral Tablet TAKE 1 TABLET BY MOUTH TWICE A DAY AS NEEDED FOR LUPUS FLARE    prochlorperazine (COMPAZINE) 10 mg Oral Tablet Take 1 Tablet (10 mg total) by mouth    RESTASIS MULTIDOSE 0.05 % Ophthalmic Drops USE 1 DROP IN EACH EYE TWICE A DAY    riboflavin, vitamin B2, (VITAMIN B-2) 100 mg Oral Tablet Take 1 Tablet (100 mg total) by mouth    rizatriptan (MAXALT) 10 mg Oral Tablet TAKE 1 TABLET AS NEEDED ON ONSET OF HEADACHE, MAY REPEAT AFTER 2 HOURS UP TO 2 IN 24 HOURS    rOPINIRole (REQUIP) 1 mg Oral Tablet Take 1-2 Tablets (1-2 mg total) by mouth    solifenacin (VESICARE) 10 mg Oral Tablet Take 1 Tablet (10 mg total) by mouth Once a day    SYMBICORT 160-4.5  mcg/actuation Inhalation oral inhaler 2 Puffs Twice daily    triamcinolone acetonide 0.1 % Cream APPLY TO AFFECTED AREA TWICE A DAY FOR 2WEEKS OUT OF THE MONTH OR LESS    TRINTELLIX 10 mg Oral Tablet TAKE 1 TABLET (10 MG) BY MOUTH DAILY.    TYMLOS 80 mcg (3,120 mcg/1.56 mL) Subcutaneous Pen Injector Inject 80 mcg under the skin    vitamin A (AQUASOL A) 3,000 mcg (10,000 unit) Oral Capsule Take 1 Capsule (10,000 Units total) by mouth Once a day 1 a day    vitamin E 400 unit Oral Capsule Take 1 Capsule (400 Units total) by mouth Once a day       Allergies   Allergen Reactions    Augmentin [Amoxicillin-Pot Clavulanate]     Azithromycin     Celebrex [  Celecoxib]     Cephalosporins     Chlorhexidine Towelette     Clavulanic Acid     Codeine     Cymbalta [Duloxetine]     Penicillins     Tramadol      PHYSICAL EXAM:  BP 132/84 (Site: Left, Patient Position: Sitting, Cuff Size: Adult)   Pulse 80   Temp 36.7 C (98 F) (Temporal)   Ht 1.702 m (5\' 7" )   Wt 90.3 kg (199 lb)   SpO2 100%   BMI 31.17 kg/m      ECOG Status: (3) Capable of limited self-care, confined to bed or chair > 50% of waking hours   Physical Exam  HENT:      Mouth/Throat:      Mouth: Mucous membranes are moist.      Pharynx: Oropharynx is clear.   Eyes:      Extraocular Movements: Extraocular movements intact.   Cardiovascular:      Rate and Rhythm: Normal rate and regular rhythm.      Pulses: Normal pulses.      Heart sounds: Normal heart sounds.   Pulmonary:      Effort: Pulmonary effort is normal.      Breath sounds: Normal breath sounds.   Abdominal:      Palpations: Abdomen is soft.   Musculoskeletal:      Cervical back: Normal range of motion and neck supple.   Neurological:      Mental Status: She is alert.        LABS:     CBC  Diff   Lab Results   Component Value Date/Time    WBC 6.3 10/23/2022 12:49 PM    HGB 14.2 10/23/2022 12:49 PM    HCT 42.3 (H) 10/23/2022 12:49 PM    PLTCNT 264 10/23/2022 12:49 PM    RBC 4.52 10/23/2022 12:49 PM    MCV  93.7 10/23/2022 12:49 PM    MCHC 33.5 10/23/2022 12:49 PM    MCH 31.3 10/23/2022 12:49 PM    RDW 12.9 10/23/2022 12:49 PM    MPV 8.2 10/23/2022 12:49 PM    Lab Results   Component Value Date/Time    PMNS 83 (H) 07/17/2022 02:24 PM    LYMPHOCYTES 10 (L) 07/17/2022 02:24 PM    EOSINOPHIL 1 07/17/2022 02:24 PM    MONOCYTES 6 07/17/2022 02:24 PM    BASOPHILS 0 07/17/2022 02:24 PM    BASOPHILS 0.00 07/17/2022 02:24 PM    PMNABS 7.60 07/17/2022 02:24 PM    LYMPHSABS 0.90 (L) 07/17/2022 02:24 PM    EOSABS 0.10 07/17/2022 02:24 PM    MONOSABS 0.50 07/17/2022 02:24 PM        Comprehensive Metabolic Profile    Lab Results   Component Value Date    SODIUM 144 10/23/2022    POTASSIUM 4.2 10/23/2022    CHLORIDE 109 (H) 10/23/2022    CO2 28 10/23/2022    ANIONGAP 7 10/23/2022    BUN 18 10/23/2022    CREATININE 1.09 10/23/2022    ALBUMIN 3.0 (L) 10/23/2022    CALCIUM 8.7 10/23/2022    GLUCOSENF 70 (L) 10/23/2022    ALKPHOS 98 10/23/2022    ALT 19 10/23/2022    AST 24 10/23/2022    TOTBILIRUBIN 0.5 10/23/2022    TOTALPROTEIN 5.2 (L) 10/23/2022     ESTIMATED GFR   Date Value Ref Range Status   10/23/2022 62 >59 mL/min/1.56m^2 Final     IRON   Date Value Ref  Range Status   10/23/2022 118 50 - 212 ug/dL Final     FERRITIN   Date Value Ref Range Status   07/17/2022 54 11 - 336 ng/mL Final     TOTAL IRON BINDING CAPACITY   Date Value Ref Range Status   10/23/2022 246 (L) 250 - 450 ug/dL Final     UIBC   Date Value Ref Range Status   10/23/2022 128 (L) 130 - 375 ug/dL Final     IRON (TRANSFERRIN) SATURATION   Date Value Ref Range Status   10/23/2022 48 15 - 50 % Final     VITAMIN B 12   Date Value Ref Range Status   07/17/2022 883 180 - 914 pg/mL Final       ASSESSMENT:    ICD-10-CM    1. Iron deficiency anemia  D50.9 COMPREHENSIVE METABOLIC PANEL, NON-FASTING     FERRITIN     IRON TRANSFERRIN AND TIBC     CBC/DIFF          PLAN:   Patient will have labs completed today as ordered. She will continue her current medications as prescribed  and follow up with all of her other providers as scheduled. She will follow up with [place] in 3 months or sooner if needed.       Abigail Carlson was given the chance to ask questions, and these were answered to their satisfaction. The patient is welcome to call with any questions or concerns in the meantime.     On the day of the encounter, a total of  26 minutes was spent on this patient encounter including review of historical information, examination, documentation and post-visit activities.   Return in about 3 months (around 01/23/2023).     Benjaman Pott, APRN,FNP-BC ,10/23/2022 ,13:21     CC:  Norval Gable, NP  [address]  [place] [address]    Norval Gable, NP  [address]  [address]  [place],  [place] [address]      This note was partially generated using MModal Fluency Direct system, and there may be some incorrect words, spellings, and punctuation that were not noted in checking the note before saving.

## 2023-01-07 ENCOUNTER — Other Ambulatory Visit (HOSPITAL_COMMUNITY): Payer: Self-pay

## 2023-01-07 DIAGNOSIS — M5416 Radiculopathy, lumbar region: Secondary | ICD-10-CM

## 2023-01-08 ENCOUNTER — Ambulatory Visit (HOSPITAL_COMMUNITY): Payer: Self-pay

## 2023-01-09 IMAGING — MR MRI LUMBAR SPINE WITHOUT CONTRAST
4 of 6 series · 29 of 48 positions shown · IV contrast (gadolinium)
Comparison: MRI dated 01/06/2019.

﻿EXAM:  [id number]   MRI LUMBAR SPINE WITHOUT CONTRAST
INDICATION: 47-year-old with persistent low back pain and radicular symptoms to both hips and lower extremities.  Previous back surgery.
TECHNIQUE: Multiplanar, multisequential MRI of the lumbosacral spine was performed without gadolinium contrast.

[Series 5: T2 · sagittal · 4.5mm · 0.94mm/px · 7 of 13 slices shown (1 of 3)]
[im 1/13]
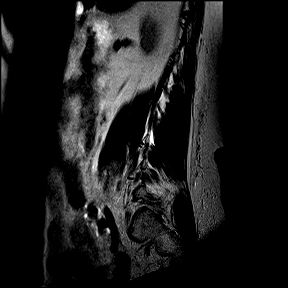
[im 3/13]
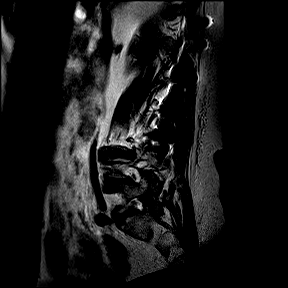
[im 5/13]
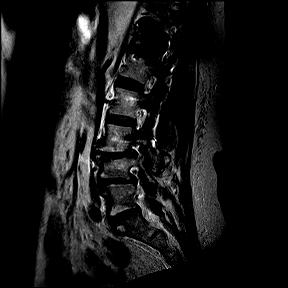
[im 7/13]
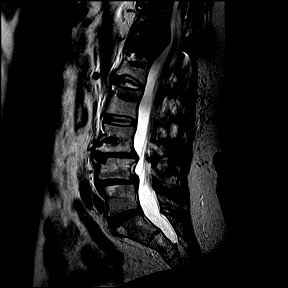
[im 9/13]
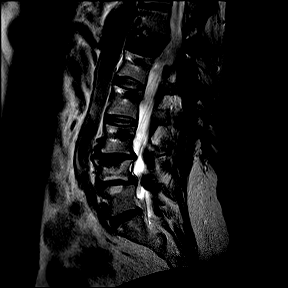
[im 11/13]
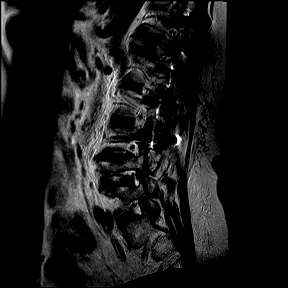
[im 13/13]
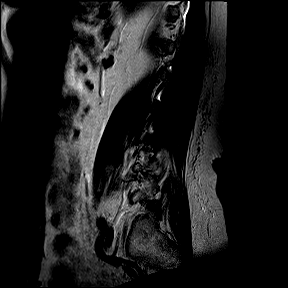

[Series 6: T1 · sagittal · 4.5mm · 0.94mm/px · 5 of 13 slices shown]
[im 1/13]
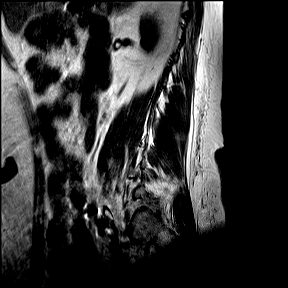
[im 3/13]
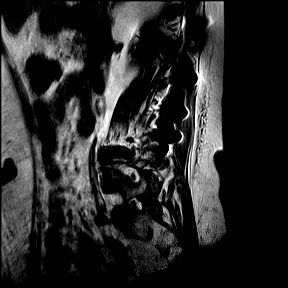
[im 5/13]
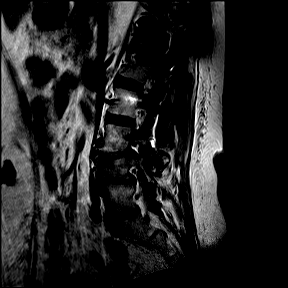
[im 8/13]
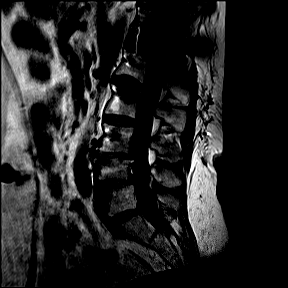
[im 13/13]
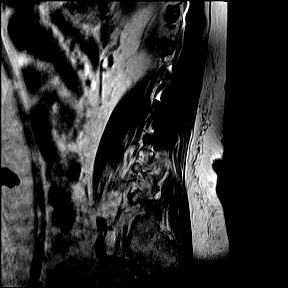

[Series 8: T2 · coronal · 5.0mm · 0.82mm/px · 9 of 18 slices shown (2 of 3)]
[im 1/18]
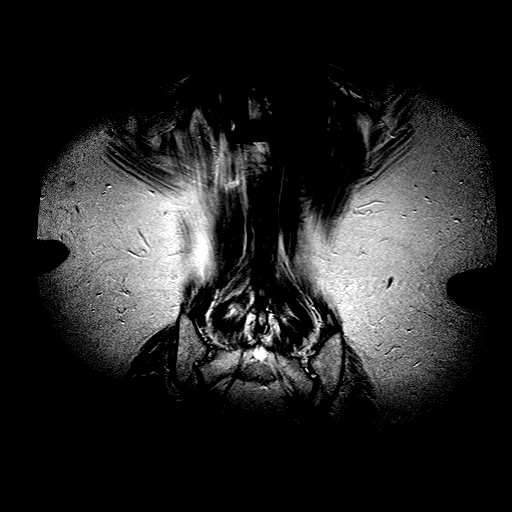
[im 3/18]
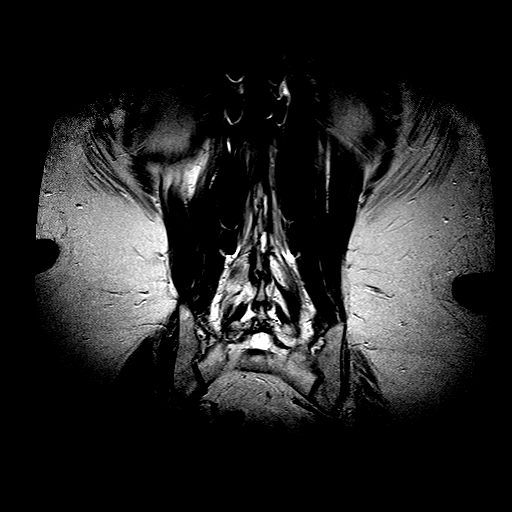
[im 5/18]
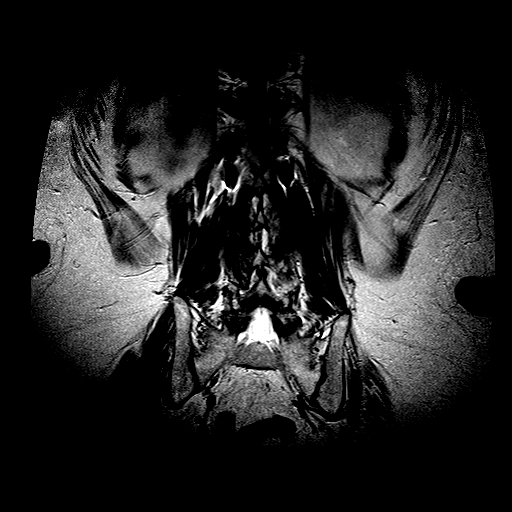
[im 7/18]
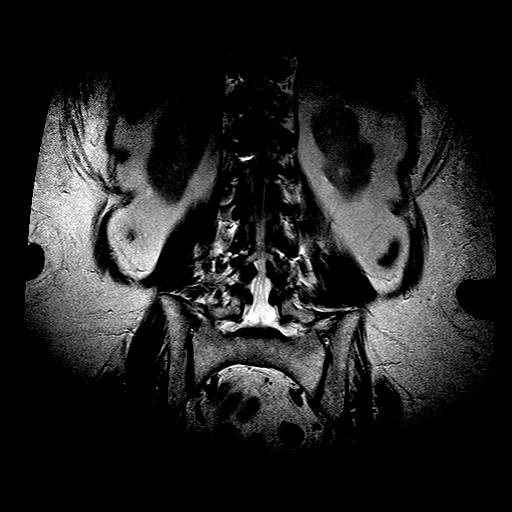
[im 9/18]
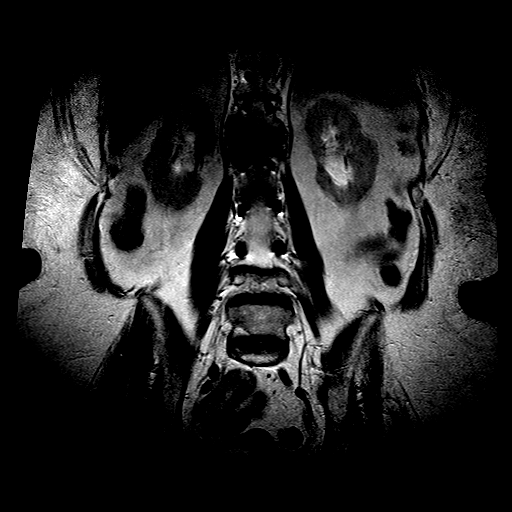
[im 11/18]
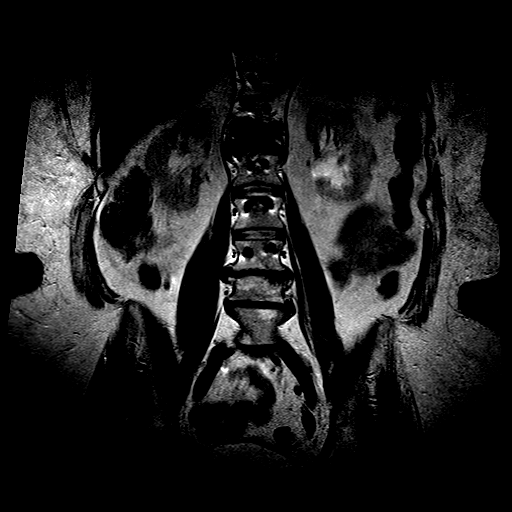
[im 13/18]
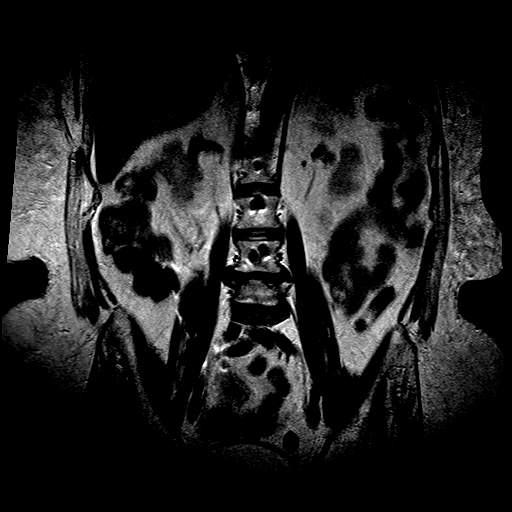
[im 15/18]
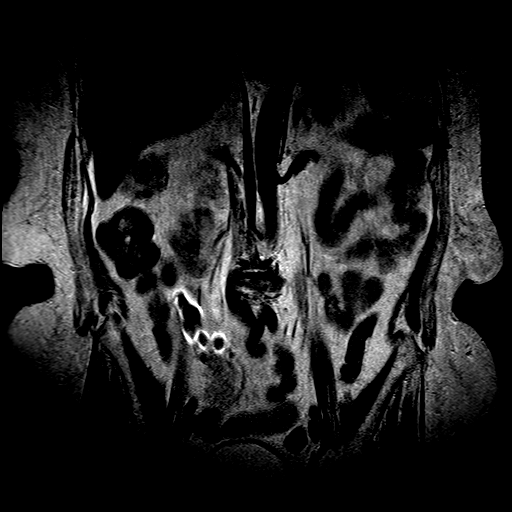
[im 18/18]
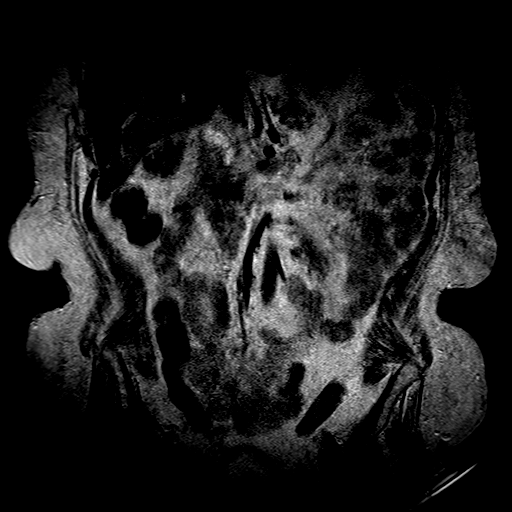

[Series 9: T2 · axial · 4.0mm · 0.52mm/px · z∈[-181,+54]mm · 8 of 21 slices shown (3 of 3)]
[im 1/21]
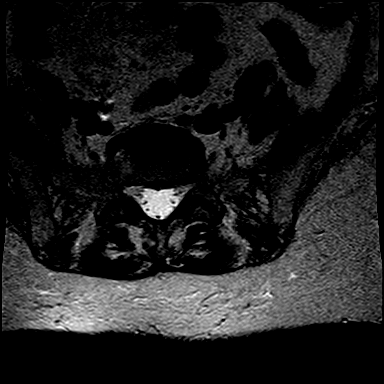
[im 3/21]
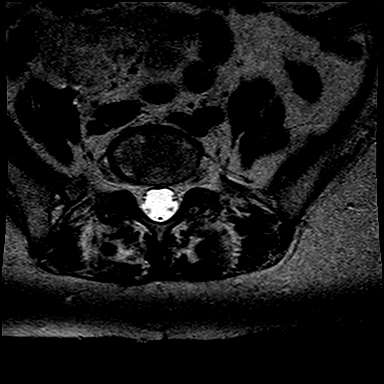
[im 7/21]
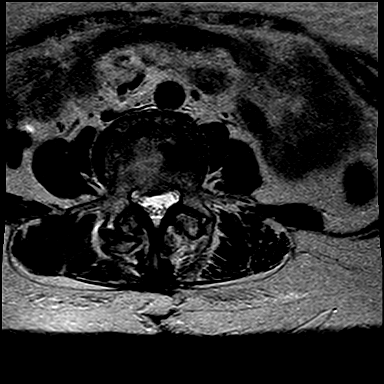
[im 9/21]
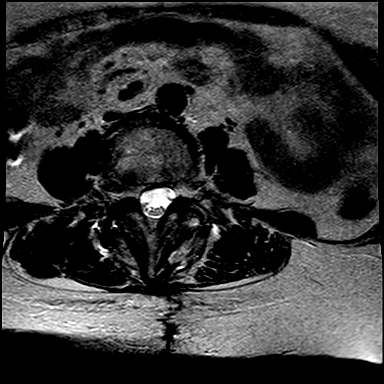
[im 12/21]
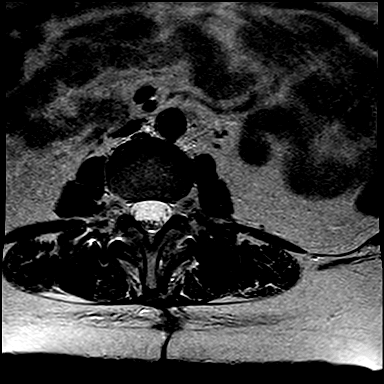
[im 14/21]
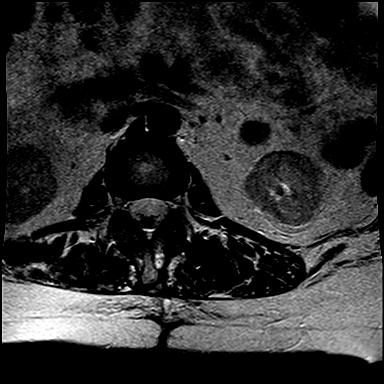
[im 18/21]
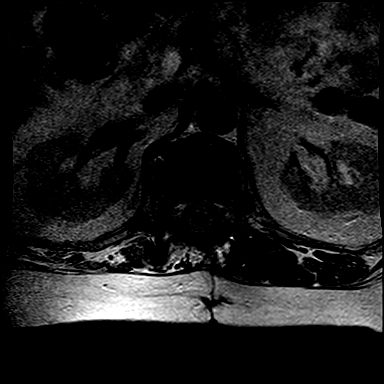
[im 21/21]
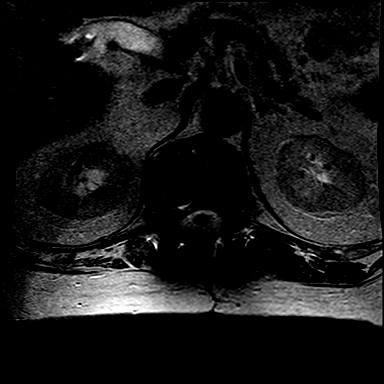

[29 of 48 positions shown; findings below may reference images not displayed]

FINDINGS: Evidence of previous fracture of T12 vertebral body is noted with kyphoplasty and posterior fixation hardware extending from T10, up to L3 level.  This was not seen on previous MRI examination of 01/04/2019. 

Retropulsion of posterior superior margin of T12 vertebra due to previous fracture is moderately impinging on thecal sac in the midline.

At L1-2 level, no focal disc lesions are seen.  L2-3 disc shows no focal abnormalities. 

At L3-4 level, severe degenerative disc disease with loss of disc space is noted with facet arthropathy and minimal anterolisthesis.  Asymmetrically bulging annulus to the right with facet arthropathy is causing significant compromise of right lateral recess.  AP diameter of thecal sac in the midline measures 10 mm.  Findings at L3-4 level are more progressive compared with examination of 01/06/2019.

At L4-5 level, severe degenerative disc disease with minimal degenerative anterolisthesis.  Bulging annulus is noted with facet arthropathy causing significant compromise of both lateral recess and neural foramina. Findings at L4-5 level are more progressive compared with examination dated 01/06/2019.

At L5-S1 level, asymmetric bulging annulus to the left and facet arthropathy are causing mild left foraminal narrowing not seen on previous study.

Paravertebral soft tissues are unremarkable.
IMPRESSION: 1. Interval appearance of fracture of T12 vertebra with kyphoplasty and posterior fixation hardware placement from T10 level up to L3 level. Retropulsion of posterior superior margin of T12 vertebra due to the previous fracture is causing moderate compromise of thecal sac in the midline at this level.

2.  At L3-4 level, severe degenerative disc disease with loss of disc space is noted with facet arthropathy and minimal anterolisthesis.  Asymmetrically bulging annulus to the right with facet arthropathy is causing significant compromise of right lateral recess.  AP diameter of thecal sac in the midline measures 10 mm.  Findings at L3-4 level are more progressive compared with examination of 01/06/2019.

3.At L4-5 level, severe degenerative disc disease with minimal degenerative anterolisthesis.  Bulging annulus is noted with facet arthropathy causing significant compromise of both lateral recess and neural foramina. Findings at L4-5 level are more progressive compared with examination dated 01/06/2019.

[DATE].At L5-S1 level, asymmetric bulging annulus to the left and facet arthropathy are causing mild left foraminal narrowing not seen on previous study.

5. Findings at other levels are described above in detail.

## 2023-01-31 ENCOUNTER — Other Ambulatory Visit: Payer: Self-pay

## 2023-01-31 ENCOUNTER — Ambulatory Visit: Payer: Commercial Managed Care - PPO | Attending: NURSE PRACTITIONER | Admitting: NURSE PRACTITIONER

## 2023-01-31 ENCOUNTER — Encounter (INDEPENDENT_AMBULATORY_CARE_PROVIDER_SITE_OTHER): Payer: Self-pay | Admitting: NURSE PRACTITIONER

## 2023-01-31 ENCOUNTER — Inpatient Hospital Stay (INDEPENDENT_AMBULATORY_CARE_PROVIDER_SITE_OTHER)
Admission: RE | Admit: 2023-01-31 | Discharge: 2023-01-31 | Disposition: A | Discharge status: Home / Self Care | Payer: Commercial Managed Care - PPO | Source: Ambulatory Visit | Attending: NURSE PRACTITIONER | Admitting: NURSE PRACTITIONER

## 2023-01-31 VITALS — BP 123/91 | HR 78 | Temp 97.9°F | Ht 67.0 in | Wt 190.6 lb

## 2023-01-31 DIAGNOSIS — Z79899 Other long term (current) drug therapy: Secondary | ICD-10-CM | POA: Insufficient documentation

## 2023-01-31 DIAGNOSIS — D509 Iron deficiency anemia, unspecified: Secondary | ICD-10-CM | POA: Insufficient documentation

## 2023-01-31 DIAGNOSIS — Z9884 Bariatric surgery status: Secondary | ICD-10-CM | POA: Insufficient documentation

## 2023-01-31 DIAGNOSIS — E559 Vitamin D deficiency, unspecified: Secondary | ICD-10-CM | POA: Insufficient documentation

## 2023-01-31 DIAGNOSIS — F439 Reaction to severe stress, unspecified: Secondary | ICD-10-CM | POA: Insufficient documentation

## 2023-01-31 DIAGNOSIS — Z89612 Acquired absence of left leg above knee: Secondary | ICD-10-CM | POA: Insufficient documentation

## 2023-01-31 LAB — FERRITIN: FERRITIN: 43 ng/mL (ref 11–336)

## 2023-01-31 LAB — CBC WITH DIFF
BASOPHIL #: 0 10*3/uL (ref 0.00–0.10)
BASOPHIL %: 0 % (ref 0–1)
EOSINOPHIL #: 0.2 10*3/uL (ref 0.00–0.50)
EOSINOPHIL %: 3 % (ref 1–7)
HCT: 45.8 % — ABNORMAL HIGH (ref 31.2–41.9)
HGB: 15.3 g/dL — ABNORMAL HIGH (ref 10.9–14.3)
LYMPHOCYTE #: 1.4 10*3/uL (ref 1.00–3.00)
LYMPHOCYTE %: 18 % (ref 16–44)
MCH: 31.6 pg (ref 24.7–32.8)
MCHC: 33.4 g/dL (ref 32.3–35.6)
MCV: 94.6 fL (ref 75.5–95.3)
MONOCYTE #: 0.6 10*3/uL (ref 0.30–1.00)
MONOCYTE %: 8 % (ref 5–13)
MPV: 8 fL (ref 7.9–10.8)
NEUTROPHIL #: 5.7 10*3/uL (ref 1.85–7.80)
NEUTROPHIL %: 71 % (ref 43–77)
PLATELETS: 259 10*3/uL (ref 140–440)
RBC: 4.84 10*6/uL (ref 3.63–4.92)
RDW: 12.6 % (ref 12.3–17.7)
WBC: 8.1 10*3/uL (ref 3.8–11.8)

## 2023-01-31 LAB — COMPREHENSIVE METABOLIC PANEL, NON-FASTING
ALBUMIN/GLOBULIN RATIO: 1.4 (ref 0.8–1.4)
ALBUMIN: 3.8 g/dL (ref 3.5–5.7)
ALKALINE PHOSPHATASE: 102 U/L (ref 34–104)
ALT (SGPT): 18 U/L (ref 7–52)
ANION GAP: 5 mmol/L (ref 4–13)
AST (SGOT): 29 U/L (ref 13–39)
BILIRUBIN TOTAL: 0.5 mg/dL (ref 0.3–1.0)
BUN/CREA RATIO: 14 (ref 6–22)
BUN: 15 mg/dL (ref 7–25)
CALCIUM, CORRECTED: 9.4 mg/dL (ref 8.9–10.8)
CALCIUM: 9.2 mg/dL (ref 8.6–10.3)
CHLORIDE: 106 mmol/L (ref 98–107)
CO2 TOTAL: 31 mmol/L (ref 21–31)
CREATININE: 1.09 mg/dL (ref 0.60–1.30)
ESTIMATED GFR: 62 mL/min/{1.73_m2} (ref >59)
GLOBULIN: 2.8 — ABNORMAL LOW (ref 2.9–5.4)
GLUCOSE: 72 mg/dL — ABNORMAL LOW (ref 74–109)
OSMOLALITY, CALCULATED: 283 mosm/kg (ref 270–290)
POTASSIUM: 4.8 mmol/L (ref 3.5–5.1)
PROTEIN TOTAL: 6.6 g/dL (ref 6.4–8.9)
SODIUM: 142 mmol/L (ref 136–145)

## 2023-01-31 LAB — IRON TRANSFERRIN AND TIBC
IRON (TRANSFERRIN) SATURATION: 23 % (ref 15–50)
IRON: 80 ug/dL (ref 50–212)
TOTAL IRON BINDING CAPACITY: 347 ug/dL (ref 250–450)
TRANSFERRIN: 248 mg/dL (ref 203–362)
UIBC: 267 ug/dL (ref 130–375)

## 2023-01-31 LAB — VITAMIN D 25 TOTAL: VITAMIN D 25, TOTAL: 112.54 ng/mL — ABNORMAL HIGH (ref 30.00–100.00)

## 2023-01-31 NOTE — Cancer Center Note (Signed)
Department of Hematology/Oncology  History and Physical    Name: Abigail Carlson  ZOX:[id number]  Date of Birth: 1975-12-24  Encounter Date: 01/31/2023    REFERRING PROVIDER:  Norval Gable, NP  [address]  [id number]  [place],  [place] [address]    REASON FOR OFFICE VISIT:  Follow up for evaluation and management of  iron-deficiency anemia.    HISTORY OF PRESENT ILLNESS:  Abigail Carlson is a 47 y.o. female who presents today for iron-deficiency anemia.  She was initially seen at [facility] on October 08, 2013.  She was last seen on 07/11/2020 at [facility].  She has had Imferon in the past and tolerated it well.  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  The patient states that she follows up to get her prosthetic leg next week. She has been under a lot of stress due to losing her mother and father about a month apart. She is doing good. She denies any new complaints or issues at this time.     ROS:   Pertinent review of systems as discussed in HPI    History:  Past Medical History:   Diagnosis Date    AKA stump complication (CMS HCC)     Anemia     Anxiety state     Asthma     Carpal tunnel syndrome     Depression     Diabetes mellitus, type 2 (CMS HCC)     Esophageal reflux     Fibromyalgia     Hiatal hernia     History of IBS     Hypovitaminosis D     Kidney stones     Migraines     Osteoarthritis     PTSD (post-traumatic stress disorder)     RLS (restless legs syndrome)     SCC (squamous cell carcinoma)     Systemic lupus erythematosus (CMS HCC)      Past Surgical History:   Procedure Laterality Date    HX APPENDECTOMY      HX CERVICAL DISKECTOMY      HX CHOLECYSTECTOMY      HX GASTRIC BYPASS      HX TONSILLECTOMY      LAMINECTOMY      LEG AMPUTATION Left     REPLACEMENT TOTAL KNEE BILATERAL      SPINAL FUSION       Social History      Socioeconomic History    Marital status: Single     Spouse name: Not on file    Number of children: Not on file    Years of education: Not on file    Highest education level: Not on file   Occupational History    Not on file   Tobacco Use    Smoking status: Never    Smokeless tobacco: Never   Vaping Use    Vaping status: Never Used   Substance and Sexual Activity    Alcohol use: Never    Drug use: Never    Sexual activity: Not on file   Other Topics Concern    Not on file   Social History Narrative    Not on file     Social Determinants of Health     Financial Resource Strain: Not on file   Transportation Needs: Not on file   Social  Connections: Not on file   Intimate Partner Violence: Not on file   Housing Stability: Not on file     Social History     Social History Narrative    Not on file     Social History     Substance and Sexual Activity   Drug Use Never     Family Medical History:       Problem Relation (Age of Onset)    Elevated Lipids Father    Heart Disease Father    Hypertension (High Blood Pressure) Mother    Kidney Cancer Mother    Migraines Mother          Current Outpatient Medications   Medication Sig    acetaminophen (TYLENOL) 500 mg Oral Tablet Take 2 Tablets (1,000 mg total) by mouth    albuterol sulfate (PROVENTIL OR VENTOLIN OR PROAIR) 90 mcg/actuation Inhalation oral inhaler Take 1-2 Puffs by inhalation Every 6 hours as needed    Alcohol Swabs Pads, Medicated Apply 1 Swab topically    ALPRAZolam (XANAX) 0.5 mg Oral Tablet Take 1 Tablet (0.5 mg total) by mouth Every night as needed for Insomnia    ascorbic acid, vitamin C, (VITAMIN C) 250 mg Oral Tablet Take 1 Tablet (250 mg total) by mouth    aspirin 81 mg Oral Tablet, Chewable Chew 1 Tablet (81 mg total) Once a day    Blood Sugar Diagnostic Strip 1 Strip    buPROPion (WELLBUTRIN XL) 150 mg extended release 24 hr tablet Take 2 Tablets (300 mg total) by mouth    calcium carbonate-vitamin D3 500 mg-5 mcg (200 unit) Oral Tablet Take 1 Tablet  by mouth    cholecalciferol, vitamin D3, 25 mcg (1,000 unit) Oral Tablet Take 1 Tablet (1,000 Units total) by mouth Once a day    citalopram (CELEXA) 20 mg Oral Tablet Take 1 Tablet (20 mg total) by mouth Once a day    clobetasoL (TEMOVATE) 0.05 % Cream APPLY TWICE A DAY TO RASH ON ARMS X 1 MONTH. DO NOT APPLY TO FACE.    Dexlansoprazole (DEXILANT) 60 mg Oral Cap, Delayed Rel., Multiphasic Take 1 Capsule (60 mg total) by mouth Once a day    Diaper,Brief, Adult,Disposable Misc     diclofenac sodium (VOLTAREN) 1 % Gel APPLY TO AFFECTED AREA TWICE DAILY AS NEEDED    diphenoxylate-atropine (LOMOTIL) 2.5-0.025 mg Oral Tablet     EPINEPHrine 0.3 mg/0.3 mL Injection Auto-Injector USE AS DIRECTED AS NEEDED    ergocalciferol, vitamin D2, (DRISDOL) 1,250 mcg (50,000 unit) Oral Capsule TAKE 1 CAPSULE BY MOUTH 2 TIMES WEEKLY    ferrous sulfate (FEOSOL) 325 mg (65 mg iron) Oral Tablet TAKE 1 TABLET BY MOUTH EVERY OTHER DAY    gabapentin (NEURONTIN) 600 mg Oral Tablet Take 1 Tablet (600 mg total) by mouth Three times a day    hydrocortisone 2.5 % Cream two times daily .    hydroxychloroquine (PLAQUENIL) 200 mg Oral Tablet Take 1 Tablet (200 mg total) by mouth Once a day    Levetiracetam 500 mg Oral Tablet Sustained Release 24 hr Take 1 Tablet (500 mg total) by mouth Every night    magnesium oxide (MAG-OX) 400 mg Oral Tablet Take 1 Tablet (400 mg total) by mouth Once a day    melatonin 3 mg Oral Tablet Take 10 mg by mouth    methocarbamoL (ROBAXIN) 500 mg Oral Tablet TAKE 1 TABLET FOUR TIMES DAILY AS NEEDED BY MOUTH FOR MUSCLE SPASMS OR PAIN.  metoprolol tartrate (LOPRESSOR) 25 mg Oral Tablet Take 1 Tablet (25 mg total) by mouth    mirabegron (MYRBETRIQ) 50 mg Oral Tablet Sustained Release 24 hr Take 1 Tablet (50 mg total) by mouth Once a day    mupirocin (BACTROBAN) 2 % Ointment Apply 1 Application topically    naloxone (NARCAN) 4 mg per spray nasal spray INHALE 1 SPRAY (4 MG) INTO NOSTRIL ONE TIME IF NEEDED    naproxen sodium  (ANAPROX) 550 mg Oral Tablet Take 1 Tablet (550 mg total) by mouth    nitroGLYCERIN (NITROSTAT) 0.4 mg Sublingual Tablet, Sublingual PLACE 1 TABLET UNDER TONGUE EVERY 5 MINS, UP TO 3 DOSES AS NEEDED FOR CHEST PAIN    nystatin (NYSTOP) 100,000 unit/gram Powder Apply topically    ondansetron (ZOFRAN) 8 mg Oral Tablet     Oxycodone (ROXICODONE) 10 mg Oral Tablet Take 1 Tablet (10 mg total) by mouth    OZEMPIC 1 mg/dose (4 mg/3 mL) Subcutaneous Pen Injector INJECT 1 MG SUBCUTANEOUSLY WEEKLY    Pen Needle, Disposable, 31 gauge x 3/16" Needle Inject 1 Each under the skin    phentermine (ADIPEX-P) 37.5 mg Oral Tablet Take 1 Tablet (37.5 mg total) by mouth    prednisoLONE acetate (PRED FORTE) 1 % Ophthalmic Drops, Suspension     predniSONE (DELTASONE) 10 mg Oral Tablet TAKE 1 TABLET BY MOUTH TWICE A DAY AS NEEDED FOR LUPUS FLARE    prochlorperazine (COMPAZINE) 10 mg Oral Tablet Take 1 Tablet (10 mg total) by mouth    RESTASIS MULTIDOSE 0.05 % Ophthalmic Drops USE 1 DROP IN EACH EYE TWICE A DAY    riboflavin, vitamin B2, (VITAMIN B-2) 100 mg Oral Tablet Take 1 Tablet (100 mg total) by mouth    rizatriptan (MAXALT) 10 mg Oral Tablet TAKE 1 TABLET AS NEEDED ON ONSET OF HEADACHE, MAY REPEAT AFTER 2 HOURS UP TO 2 IN 24 HOURS    rOPINIRole (REQUIP) 1 mg Oral Tablet Take 1-2 Tablets (1-2 mg total) by mouth    solifenacin (VESICARE) 10 mg Oral Tablet Take 1 Tablet (10 mg total) by mouth Once a day    SYMBICORT 160-4.5 mcg/actuation Inhalation oral inhaler 2 Puffs Twice daily    triamcinolone acetonide 0.1 % Cream APPLY TO AFFECTED AREA TWICE A DAY FOR 2WEEKS OUT OF THE MONTH OR LESS    TYMLOS 80 mcg (3,120 mcg/1.56 mL) Subcutaneous Pen Injector Inject 80 mcg under the skin    vitamin A (AQUASOL A) 3,000 mcg (10,000 unit) Oral Capsule Take 1 Capsule (10,000 Units total) by mouth Once a day 1 a day    vitamin E 400 unit Oral Capsule Take 1 Capsule (400 Units total) by mouth Once a day       Allergies   Allergen Reactions    Augmentin  [Amoxicillin-Pot Clavulanate]     Azithromycin     Celebrex [Celecoxib]     Cephalosporins     Chlorhexidine Towelette     Clavulanic Acid     Codeine     Cymbalta [Duloxetine]     Penicillins     Tramadol      PHYSICAL EXAM:  BP (!) 123/91 (Site: Left Arm, Patient Position: Sitting, Cuff Size: Adult)   Pulse 78   Temp 36.6 C (97.9 F) (Temporal)   Ht 1.702 m (5\' 7" )   Wt 86.5 kg (190 lb 9.6 oz)   SpO2 95%   BMI 29.85 kg/m      ECOG Status: (3) Capable of limited self-care,  confined to bed or chair > 50% of waking hours   Physical Exam  HENT:      Mouth/Throat:      Mouth: Mucous membranes are moist.      Pharynx: Oropharynx is clear.   Eyes:      Extraocular Movements: Extraocular movements intact.   Cardiovascular:      Rate and Rhythm: Normal rate and regular rhythm.      Pulses: Normal pulses.      Heart sounds: Normal heart sounds.   Pulmonary:      Effort: Pulmonary effort is normal.      Breath sounds: Normal breath sounds.   Abdominal:      Palpations: Abdomen is soft.   Musculoskeletal:      Cervical back: Normal range of motion and neck supple.   Neurological:      Mental Status: She is alert.        LABS:     CBC  Diff   Lab Results   Component Value Date/Time    WBC 8.1 01/31/2023 12:53 PM    HGB 15.3 (H) 01/31/2023 12:53 PM    HCT 45.8 (H) 01/31/2023 12:53 PM    PLTCNT 259 01/31/2023 12:53 PM    RBC 4.84 01/31/2023 12:53 PM    MCV 94.6 01/31/2023 12:53 PM    MCHC 33.4 01/31/2023 12:53 PM    MCH 31.6 01/31/2023 12:53 PM    RDW 12.6 01/31/2023 12:53 PM    MPV 8.0 01/31/2023 12:53 PM    Lab Results   Component Value Date/Time    PMNS 71 01/31/2023 12:53 PM    LYMPHOCYTES 18 01/31/2023 12:53 PM    EOSINOPHIL 3 01/31/2023 12:53 PM    MONOCYTES 8 01/31/2023 12:53 PM    BASOPHILS 0 01/31/2023 12:53 PM    BASOPHILS 0.00 01/31/2023 12:53 PM    PMNABS 5.70 01/31/2023 12:53 PM    LYMPHSABS 1.40 01/31/2023 12:53 PM    EOSABS 0.20 01/31/2023 12:53 PM    MONOSABS 0.60 01/31/2023 12:53 PM        Comprehensive  Metabolic Profile    Lab Results   Component Value Date    SODIUM 144 10/23/2022    POTASSIUM 4.2 10/23/2022    CHLORIDE 109 (H) 10/23/2022    CO2 28 10/23/2022    ANIONGAP 7 10/23/2022    BUN 18 10/23/2022    CREATININE 1.09 10/23/2022    ALBUMIN 3.0 (L) 10/23/2022    CALCIUM 8.7 10/23/2022    GLUCOSENF 70 (L) 10/23/2022    ALKPHOS 98 10/23/2022    ALT 19 10/23/2022    AST 24 10/23/2022    TOTBILIRUBIN 0.5 10/23/2022    TOTALPROTEIN 5.2 (L) 10/23/2022     ESTIMATED GFR   Date Value Ref Range Status   10/23/2022 62 >59 mL/min/1.9m^2 Final     IRON   Date Value Ref Range Status   10/23/2022 118 50 - 212 ug/dL Final     FERRITIN   Date Value Ref Range Status   10/23/2022 56 11 - 336 ng/mL Final     TOTAL IRON BINDING CAPACITY   Date Value Ref Range Status   10/23/2022 246 (L) 250 - 450 ug/dL Final     UIBC   Date Value Ref Range Status   10/23/2022 128 (L) 130 - 375 ug/dL Final     IRON (TRANSFERRIN) SATURATION   Date Value Ref Range Status   10/23/2022 48 15 - 50 % Final  VITAMIN B 12   Date Value Ref Range Status   07/17/2022 883 180 - 914 pg/mL Final       ASSESSMENT:    ICD-10-CM    1. Iron deficiency anemia  D50.9             PLAN:   Patient will have labs completed today as ordered. She will continue her current medications as prescribed and follow up with all of her other providers as scheduled. She will follow up with [place] in 6 months or sooner if needed.       Abigail Carlson was given the chance to ask questions, and these were answered to their satisfaction. The patient is welcome to call with any questions or concerns in the meantime.     On the day of the encounter, a total of  12 minutes was spent on this patient encounter including review of historical information, examination, documentation and post-visit activities.   Return in about 6 months (around 08/01/2023).     Benjaman Pott, APRN,FNP-BC ,01/31/2023 ,13:20     CC:  Norval Gable, NP  [address]  [place] [address]    Norval Gable, NP  [address]  [address]  [place],  [place] [address]      This note was partially generated using M[employer] Fluency Direct system, and there may be some incorrect words, spellings, and punctuation that were not noted in checking the note before saving.

## 2023-03-11 ENCOUNTER — Other Ambulatory Visit: Payer: Self-pay

## 2023-03-11 ENCOUNTER — Ambulatory Visit (INDEPENDENT_AMBULATORY_CARE_PROVIDER_SITE_OTHER): Payer: Commercial Managed Care - PPO | Admitting: Physician Assistant

## 2023-03-11 VITALS — BP 121/71 | HR 81 | Ht 67.0 in | Wt 186.0 lb

## 2023-03-11 DIAGNOSIS — N393 Stress incontinence (female) (male): Secondary | ICD-10-CM

## 2023-03-11 DIAGNOSIS — Z87442 Personal history of urinary calculi: Secondary | ICD-10-CM

## 2023-03-11 DIAGNOSIS — N3946 Mixed incontinence: Secondary | ICD-10-CM

## 2023-03-11 DIAGNOSIS — R351 Nocturia: Secondary | ICD-10-CM

## 2023-03-11 DIAGNOSIS — N3281 Overactive bladder: Secondary | ICD-10-CM

## 2023-03-11 MED ORDER — SOLIFENACIN 10 MG TABLET
10.0000 mg | ORAL_TABLET | Freq: Every day | ORAL | 3 refills | Status: DC
Start: 2023-03-11 — End: 2024-01-01

## 2023-03-11 MED ORDER — MIRABEGRON ER 50 MG TABLET,EXTENDED RELEASE 24 HR
1.0000 | ORAL_TABLET | Freq: Every day | ORAL | 3 refills | Status: DC
Start: 2023-03-11 — End: 2024-01-01

## 2023-03-11 NOTE — Progress Notes (Signed)
[facility]  UROLOGY, [facility]    Progress Note    Name: YOUSRA WEBBER MRN:  [id number]   Date: 03/11/2023 Age: 47 y.o.       Chief Complaint: Follow Up 6 Months, Overactive Bladder, Kidney Stones, and Urge Urinary Incontinence    Subjective:     Mrs. 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  She is on Myrbetriq 50 mg for her overactive bladder in his happy with the results.  She has to wear 0-1 pads per day.  She does not have significant bother from her incontinence.  She reports a history of kidney stones. She denies fevers, chills, nausea, vomiting, hematuria, dysuria, flank pain, incontinence, dribbling, hesitancy, suprapubic pain, headaches, vision changes, shortness of breath, chest pain.    Today, patient presents to the clinic for OAB and nocturia on vesicare 10 mg and myrbetriq 50 mg. Patient reports nocturia q2h . Patient reports continues to experience symptom control during the day, but still experiences some leaking requiring 0-1 pads daily. Patient reports nocturia improved to 5-6 pads, previously up to 10 pads. P Patient denies fevers, chills, nausea, vomiting, hematuria, dysuria, flank pain, dribbling, hesitancy, suprapubic pain, headaches, vision changes, shortness of breath, chest pain.       Objective :  BP 121/71 (Site: Left Arm, Patient Position: Sitting, Cuff Size: Adult)   Pulse 81   Ht 1.702 m (5\' 7" )   Wt 84.4 kg (186 lb)   BMI 29.13 kg/m       Gen: NAD, alert  Pulm: unlabored at rest  CV: palpable pulses  Abd: soft, Nt/ND  GU: no suprapubic tenderness, no CVAT    Data reviewed:    Current Outpatient Medications   Medication Sig    acetaminophen (TYLENOL) 500 mg Oral Tablet Take 2 Tablets (1,000 mg total) by mouth    albuterol sulfate (PROVENTIL OR VENTOLIN OR PROAIR) 90 mcg/actuation Inhalation oral inhaler Take 1-2 Puffs by inhalation Every 6 hours as needed    Alcohol Swabs Pads,  Medicated Apply 1 Swab topically    ALPRAZolam (XANAX) 0.5 mg Oral Tablet Take 1 Tablet (0.5 mg total) by mouth Every night as needed for Insomnia    ascorbic acid, vitamin C, (VITAMIN C) 250 mg Oral Tablet Take 1 Tablet (250 mg total) by mouth    aspirin 81 mg Oral Tablet, Chewable Chew 1 Tablet (81 mg total) Once a day    Blood Sugar Diagnostic Strip 1 Strip    buPROPion (WELLBUTRIN XL) 150 mg extended release 24 hr tablet Take 2 Tablets (300 mg total) by mouth    calcium carbonate-vitamin D3 500 mg-5 mcg (200 unit) Oral Tablet Take 1 Tablet by mouth    cholecalciferol, vitamin D3, 25 mcg (1,000 unit) Oral Tablet Take 1 Tablet (1,000 Units total) by mouth Once a day    citalopram (CELEXA) 20 mg Oral Tablet Take 1 Tablet (20 mg total) by mouth Once a day    clobetasoL (TEMOVATE) 0.05 % Cream APPLY TWICE A DAY TO RASH ON ARMS X 1 MONTH. DO NOT APPLY TO FACE.    Dexlansoprazole (DEXILANT) 60 mg Oral Cap, Delayed Rel., Multiphasic Take 1 Capsule (60 mg total) by mouth Once a day    Diaper,Brief, Adult,Disposable Misc     diclofenac sodium (VOLTAREN) 1 % Gel APPLY TO AFFECTED AREA TWICE DAILY AS NEEDED    diphenoxylate-atropine (LOMOTIL) 2.5-0.025 mg Oral Tablet  EPINEPHrine 0.3 mg/0.3 mL Injection Auto-Injector USE AS DIRECTED AS NEEDED    ergocalciferol, vitamin D2, (DRISDOL) 1,250 mcg (50,000 unit) Oral Capsule TAKE 1 CAPSULE BY MOUTH 2 TIMES WEEKLY    ferrous sulfate (FEOSOL) 325 mg (65 mg iron) Oral Tablet TAKE 1 TABLET BY MOUTH EVERY OTHER DAY    gabapentin (NEURONTIN) 600 mg Oral Tablet Take 1 Tablet (600 mg total) by mouth Three times a day    hydrocortisone 2.5 % Cream two times daily .    hydroxychloroquine (PLAQUENIL) 200 mg Oral Tablet Take 1 Tablet (200 mg total) by mouth Once a day    Levetiracetam 500 mg Oral Tablet Sustained Release 24 hr Take 1 Tablet (500 mg total) by mouth Every night    magnesium oxide (MAG-OX) 400 mg Oral Tablet Take 1 Tablet (400 mg total) by mouth Once a day    melatonin 3 mg  Oral Tablet Take 10 mg by mouth    methocarbamoL (ROBAXIN) 500 mg Oral Tablet TAKE 1 TABLET FOUR TIMES DAILY AS NEEDED BY MOUTH FOR MUSCLE SPASMS OR PAIN.    metoprolol tartrate (LOPRESSOR) 25 mg Oral Tablet Take 1 Tablet (25 mg total) by mouth    mirabegron (MYRBETRIQ) 50 mg Oral Tablet Sustained Release 24 hr Take 1 Tablet (50 mg total) by mouth Once a day    mupirocin (BACTROBAN) 2 % Ointment Apply 1 Application topically    naloxone (NARCAN) 4 mg per spray nasal spray INHALE 1 SPRAY (4 MG) INTO NOSTRIL ONE TIME IF NEEDED    naproxen sodium (ANAPROX) 550 mg Oral Tablet Take 1 Tablet (550 mg total) by mouth    nitroGLYCERIN (NITROSTAT) 0.4 mg Sublingual Tablet, Sublingual PLACE 1 TABLET UNDER TONGUE EVERY 5 MINS, UP TO 3 DOSES AS NEEDED FOR CHEST PAIN    nystatin (NYSTOP) 100,000 unit/gram Powder Apply topically    ondansetron (ZOFRAN) 8 mg Oral Tablet     Oxycodone (ROXICODONE) 10 mg Oral Tablet Take 1 Tablet (10 mg total) by mouth    OZEMPIC 1 mg/dose (4 mg/3 mL) Subcutaneous Pen Injector INJECT 1 MG SUBCUTANEOUSLY WEEKLY    Pen Needle, Disposable, 31 gauge x 3/16" Needle Inject 1 Each under the skin    phentermine (ADIPEX-P) 37.5 mg Oral Tablet Take 1 Tablet (37.5 mg total) by mouth    prednisoLONE acetate (PRED FORTE) 1 % Ophthalmic Drops, Suspension     predniSONE (DELTASONE) 10 mg Oral Tablet TAKE 1 TABLET BY MOUTH TWICE A DAY AS NEEDED FOR LUPUS FLARE    prochlorperazine (COMPAZINE) 10 mg Oral Tablet Take 1 Tablet (10 mg total) by mouth    RESTASIS MULTIDOSE 0.05 % Ophthalmic Drops USE 1 DROP IN EACH EYE TWICE A DAY    riboflavin, vitamin B2, (VITAMIN B-2) 100 mg Oral Tablet Take 1 Tablet (100 mg total) by mouth    rizatriptan (MAXALT) 10 mg Oral Tablet TAKE 1 TABLET AS NEEDED ON ONSET OF HEADACHE, MAY REPEAT AFTER 2 HOURS UP TO 2 IN 24 HOURS    rOPINIRole (REQUIP) 1 mg Oral Tablet Take 1-2 Tablets (1-2 mg total) by mouth    solifenacin (VESICARE) 10 mg Oral Tablet Take 1 Tablet (10 mg total) by mouth Once  a day    SYMBICORT 160-4.5 mcg/actuation Inhalation oral inhaler 2 Puffs Twice daily    triamcinolone acetonide 0.1 % Cream APPLY TO AFFECTED AREA TWICE A DAY FOR 2WEEKS OUT OF THE MONTH OR LESS    TYMLOS 80 mcg (3,120 mcg/1.56 mL) Subcutaneous Pen Injector  Inject 80 mcg under the skin    vitamin A (AQUASOL A) 3,000 mcg (10,000 unit) Oral Capsule Take 1 Capsule (10,000 Units total) by mouth Once a day 1 a day    vitamin E 400 unit Oral Capsule Take 1 Capsule (400 Units total) by mouth Once a day     Assessment/Plan  Problem List Items Addressed This Visit    None    Overactive bladder with Urge urinary incontinence on myrebtriq 50 mg  I discussed the differential diagnosis, pathophysiology and nature of patient's overactive bladder/urge urinary incontinence  I also counseled patient on conservative management options including appropriate fluid management, avoidance of diuretics including caffeine and alcohol, weight loss (if applicable), dedicated pelvic floor muscle therapy and Kegel's exercises  Additionally, we discussed the role of pharmacotherapy, including risks, benefits and alternatives:  Anticholinergic therapy (e.g. Oxybutynin, Tolterodine, Solifenacin, etc)- discussed potential risks of dry mouth, dry eyes, constipation, impaired cognition, prolonged Q-T interval and urinary retention  Beta-3 agonist therapy (e.g. Mirabegron) - discussed potential risks of hypertension, nasopharyngitis, urinary tract infection and headache  Refill provided for Mirabegron (MyrbetriqT) 50 mg P.O. daily:    I have discussed in great detail with the patient the treatment of urge incontinence/overactive bladder symptoms using mirabegron .  I have explained my rationale for using mirabegron as well as potential risks of hypertension (11.3%), nasopharyngitis (3.9%), urinary tract infection (4.2%), and tachycardia (1.6%). Patient was also cautioned that full therapeutic efficacy may take up to twelve weeks.  Patient expressed an  understanding of the treatment, possible reactions, and possible prognosis.  Refill Solifenacin succinate (VESIcareT) 10 mg P.O. daily:    I have discussed in great detail with the patient the treatment of urge incontinence/overactive bladder symptoms using solifenacin.  I have explained my rationale for using solifenacin as well as potential dose-dependent risks of dry mouth (10.9-27.6%), constipation (5.4-13.4%), and blurry vision (3.8-4.8%).  Patient was also cautioned that full therapeutic efficacy may take up to twelve weeks.  Patient expressed an understanding of the treatment, possible reactions, and possible prognosis.   No interest in intradetrusor botox at this time      Stress urinary incontinence  We discussed in detail the nature and pathophysiology of bothersome stress urinary incontinence with her today which are consistent with urethral hypermobility.  Given the bothersome nature of her symptoms, we discussed potential treatment options including:  Conservative management, including pelvic floor "Kegel" exercises, which is generally reserved for mild cases  Estrogen replacement therapy either primarily or as an adjunct, when not contraindicated, to restore blood flow to the female pelvic organs to aid in preserving vascularity to her pelvic floor  Urethral bulking agents  Synthetic midurethral sling delivered either a retropubic or transobturator approach  Bladder neck sling utilizing autologous fascia  We also discussed the incidence, nature and risks of de novo urge urinary incontinence following treatment of stress urinary incontinence, particularly following midurethral sling        History of nephrolithiasis  I discussed the differential diagnosis, pathophysiology and nature of urolithiasis and recurrent stone disease.  We also reviewed the recent 2014 American Urological Association guideline on "Medical Management of Kidney Stones" and specifically discussed dietary therapies  including:  Increase fluid intake to achieve urine output of at least 2.5 liters daily  Limit sodium intake to no more than 100 mEq (2,300 mg) per day  Consume 1,000-1,200 mg of dietary calcium per day  Limit oxalate-rich foods (beets, spinach, rhubarb, nuts, chocolate)  Limit non-dairy  animal protein  Encouraged incresed fruit and vegetable intake

## 2023-05-07 ENCOUNTER — Ambulatory Visit (INDEPENDENT_AMBULATORY_CARE_PROVIDER_SITE_OTHER): Payer: Self-pay | Admitting: NURSE PRACTITIONER

## 2023-05-07 ENCOUNTER — Ambulatory Visit (INDEPENDENT_AMBULATORY_CARE_PROVIDER_SITE_OTHER): Payer: Self-pay

## 2023-06-20 ENCOUNTER — Encounter (INDEPENDENT_AMBULATORY_CARE_PROVIDER_SITE_OTHER): Payer: Self-pay | Admitting: NURSE PRACTITIONER

## 2023-06-20 ENCOUNTER — Other Ambulatory Visit (INDEPENDENT_AMBULATORY_CARE_PROVIDER_SITE_OTHER): Payer: Self-pay | Admitting: NURSE PRACTITIONER

## 2023-06-20 ENCOUNTER — Ambulatory Visit: Payer: Self-pay | Attending: NURSE PRACTITIONER | Admitting: NURSE PRACTITIONER

## 2023-06-20 ENCOUNTER — Other Ambulatory Visit: Payer: Self-pay

## 2023-06-20 ENCOUNTER — Telehealth (INDEPENDENT_AMBULATORY_CARE_PROVIDER_SITE_OTHER): Payer: Self-pay | Admitting: NURSE PRACTITIONER

## 2023-06-20 ENCOUNTER — Ambulatory Visit (INDEPENDENT_AMBULATORY_CARE_PROVIDER_SITE_OTHER)
Admission: RE | Admit: 2023-06-20 | Discharge: 2023-06-20 | Disposition: A | Discharge status: Home / Self Care | Payer: Self-pay | Source: Ambulatory Visit | Attending: NURSE PRACTITIONER | Admitting: NURSE PRACTITIONER

## 2023-06-20 VITALS — BP 123/83 | HR 80 | Temp 98.0°F | Ht 67.0 in | Wt 198.0 lb

## 2023-06-20 DIAGNOSIS — D509 Iron deficiency anemia, unspecified: Secondary | ICD-10-CM | POA: Insufficient documentation

## 2023-06-20 DIAGNOSIS — E559 Vitamin D deficiency, unspecified: Secondary | ICD-10-CM | POA: Insufficient documentation

## 2023-06-20 DIAGNOSIS — E039 Hypothyroidism, unspecified: Secondary | ICD-10-CM | POA: Insufficient documentation

## 2023-06-20 DIAGNOSIS — E538 Deficiency of other specified B group vitamins: Secondary | ICD-10-CM

## 2023-06-20 LAB — COMPREHENSIVE METABOLIC PANEL, NON-FASTING
ALBUMIN/GLOBULIN RATIO: 1.7 — ABNORMAL HIGH (ref 0.8–1.4)
ALBUMIN: 3.9 g/dL (ref 3.5–5.7)
ALKALINE PHOSPHATASE: 89 U/L (ref 34–104)
ALT (SGPT): 15 U/L (ref 7–52)
ANION GAP: 15 mmol/L — ABNORMAL HIGH (ref 4–13)
AST (SGOT): 19 U/L (ref 13–39)
BILIRUBIN TOTAL: 0.7 mg/dL (ref 0.3–1.0)
BUN/CREA RATIO: 17 (ref 6–22)
BUN: 20 mg/dL (ref 7–25)
CALCIUM, CORRECTED: 9.8 mg/dL (ref 8.9–10.8)
CALCIUM: 9.7 mg/dL (ref 8.6–10.3)
CHLORIDE: 104 mmol/L (ref 98–107)
CO2 TOTAL: 23 mmol/L (ref 21–31)
CREATININE: 1.21 mg/dL (ref 0.60–1.30)
ESTIMATED GFR: 55 mL/min/{1.73_m2} — ABNORMAL LOW (ref >59)
GLOBULIN: 2.3 (ref 2.0–3.5)
GLUCOSE: 105 mg/dL (ref 74–109)
OSMOLALITY, CALCULATED: 286 mosm/kg (ref 270–290)
POTASSIUM: 5 mmol/L (ref 3.5–5.1)
PROTEIN TOTAL: 6.2 g/dL — ABNORMAL LOW (ref 6.4–8.9)
SODIUM: 142 mmol/L (ref 136–145)

## 2023-06-20 LAB — MAGNESIUM: MAGNESIUM: 1.5 mg/dL — ABNORMAL LOW (ref 1.9–2.7)

## 2023-06-20 LAB — IRON TRANSFERRIN AND TIBC
IRON (TRANSFERRIN) SATURATION: 26 % (ref 15–50)
IRON: 100 ug/dL (ref 50–212)
TOTAL IRON BINDING CAPACITY: 391 ug/dL (ref 250–450)
TRANSFERRIN: 279 mg/dL (ref 203–362)
UIBC: 291 ug/dL (ref 130–375)

## 2023-06-20 LAB — VITAMIN D 25 TOTAL: VITAMIN D 25, TOTAL: >120 ng/mL — ABNORMAL HIGH (ref 30.00–100.00)

## 2023-06-20 LAB — THYROID STIMULATING HORMONE WITH FREE T4 REFLEX: TSH: 0.665 u[IU]/mL (ref 0.450–5.330)

## 2023-06-20 LAB — VITAMIN B12: VITAMIN B 12: 645 pg/mL (ref 180–914)

## 2023-06-20 LAB — FERRITIN: FERRITIN: 20 ng/mL (ref 11–307)

## 2023-06-20 NOTE — Telephone Encounter (Signed)
 Notify patient that her Magnesium was low and she is agreeable to having IV Magnesium administered.

## 2023-06-20 NOTE — Cancer Center Note (Signed)
 Department of Hematology/Oncology  History and Physical    Name: Abigail Carlson  ZOX:[id number]  Date of Birth: 13-Sep-1975  Encounter Date: 06/20/2023    REFERRING PROVIDER:  Norval Gable, NP  [address]  [id number]  [place],  [place] [address]    REASON FOR OFFICE VISIT:  Follow up for evaluation and management of  iron-deficiency anemia.    HISTORY OF PRESENT ILLNESS:  Abigail Carlson is a 48 y.o. female who presents today for iron-deficiency anemia.  She was initially seen at [facility] on October 08, 2013.  She was last seen on 07/11/2020 at [facility].  She has had Imferon in the past and tolerated it well.  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  The patient states that she getting ready to have back surgery 07/01/23. She is doing well otherwise. She is ambulating now with a walker and prosthesis in place. She does have a bruise she found yesterday that had developed after her pre-admission testing she completed on Monday. She states she has been feeling some increased fatigue but otherwise doing well.     ROS:   Pertinent review of systems as discussed in HPI    History:  Past Medical History:   Diagnosis Date    AKA stump complication (CMS HCC)     Anemia     Anxiety state     Asthma     Carpal tunnel syndrome     Depression     Diabetes mellitus, type 2 (CMS HCC)     Esophageal reflux     Fibromyalgia     Hiatal hernia     History of IBS     Hypovitaminosis D     Kidney stones     Migraines     Osteoarthritis     PTSD (post-traumatic stress disorder)     RLS (restless legs syndrome)     SCC (squamous cell carcinoma)     Systemic lupus erythematosus (CMS HCC)      Past Surgical History:   Procedure Laterality Date    HX APPENDECTOMY      HX CERVICAL DISKECTOMY      HX CHOLECYSTECTOMY      HX GASTRIC BYPASS      HX TONSILLECTOMY       LAMINECTOMY      LEG AMPUTATION Left     REPLACEMENT TOTAL KNEE BILATERAL      SPINAL FUSION       Social History     Socioeconomic History    Marital status: Single     Spouse name: Not on file    Number of children: Not on file    Years of education: Not on file    Highest education level: Not on file   Occupational History    Not on file   Tobacco Use    Smoking status: Never    Smokeless tobacco: Never   Vaping Use    Vaping status: Never Used   Substance and Sexual Activity    Alcohol use: Never    Drug use: Never    Sexual activity: Not on file   Other Topics Concern    Not on file   Social History Narrative    Not on file     Social Determinants of Health     Financial Resource Strain:  Not on file   Transportation Needs: Not on file   Social Connections: Not on file   Intimate Partner Violence: Not on file   Housing Stability: High Risk (06/25/2021)    Received from [facility] [address]    SNP HRA     Think about the place you live. Do you have problems with:  Choose all that apply.: Pests such as bugs, ants, or mice,Water leaks     What is your living situation today?: I have a steady place to live     Social History     Social History Narrative    Not on file     Social History     Substance and Sexual Activity   Drug Use Never     Family Medical History:       Problem Relation (Age of Onset)    Elevated Lipids Father    Heart Disease Father    Hypertension (High Blood Pressure) Mother    Kidney Cancer Mother    Migraines Mother          Current Outpatient Medications   Medication Sig    acetaminophen (TYLENOL) 500 mg Oral Tablet Take 2 Tablets (1,000 mg total) by mouth    albuterol sulfate (PROVENTIL OR VENTOLIN OR PROAIR) 90 mcg/actuation Inhalation oral inhaler Take 1-2 Puffs by inhalation Every 6 hours as needed    Alcohol Swabs Pads, Medicated Apply 1 Swab topically    ALPRAZolam (XANAX) 0.5 mg Oral Tablet Take 1 Tablet (0.5 mg total) by mouth Every night as needed for Insomnia    ascorbic acid, vitamin C, (VITAMIN  C) 250 mg Oral Tablet Take 1 Tablet (250 mg total) by mouth    aspirin 81 mg Oral Tablet, Chewable Chew 1 Tablet (81 mg total) Once a day    Blood Sugar Diagnostic Strip 1 Strip    buPROPion (WELLBUTRIN XL) 150 mg extended release 24 hr tablet Take 2 Tablets (300 mg total) by mouth    calcium carbonate-vitamin D3 500 mg-5 mcg (200 unit) Oral Tablet Take 1 Tablet by mouth    cholecalciferol, vitamin D3, 25 mcg (1,000 unit) Oral Tablet Take 1 Tablet (1,000 Units total) by mouth Once a day    citalopram (CELEXA) 20 mg Oral Tablet Take 1 Tablet (20 mg total) by mouth Once a day    clobetasoL (TEMOVATE) 0.05 % Cream APPLY TWICE A DAY TO RASH ON ARMS X 1 MONTH. DO NOT APPLY TO FACE.    Dexlansoprazole (DEXILANT) 60 mg Oral Cap, Delayed Rel., Multiphasic Take 1 Capsule (60 mg total) by mouth Once a day    Diaper,Brief, Adult,Disposable Misc     diclofenac sodium (VOLTAREN) 1 % Gel APPLY TO AFFECTED AREA TWICE DAILY AS NEEDED    diphenoxylate-atropine (LOMOTIL) 2.5-0.025 mg Oral Tablet     EPINEPHrine 0.3 mg/0.3 mL Injection Auto-Injector USE AS DIRECTED AS NEEDED    ergocalciferol, vitamin D2, (DRISDOL) 1,250 mcg (50,000 unit) Oral Capsule TAKE 1 CAPSULE BY MOUTH 2 TIMES WEEKLY    ferrous sulfate (FEOSOL) 325 mg (65 mg iron) Oral Tablet TAKE 1 TABLET BY MOUTH EVERY OTHER DAY    gabapentin (NEURONTIN) 600 mg Oral Tablet Take 1 Tablet (600 mg total) by mouth Three times a day    hydrocortisone 2.5 % Cream two times daily .    hydroxychloroquine (PLAQUENIL) 200 mg Oral Tablet Take 1 Tablet (200 mg total) by mouth Once a day    Levetiracetam 500 mg Oral Tablet Sustained Release 24 hr Take  1 Tablet (500 mg total) by mouth Every night    magnesium oxide (MAG-OX) 400 mg Oral Tablet Take 1 Tablet (400 mg total) by mouth Once a day    melatonin 3 mg Oral Tablet Take 10 mg by mouth    methocarbamoL (ROBAXIN) 500 mg Oral Tablet TAKE 1 TABLET FOUR TIMES DAILY AS NEEDED BY MOUTH FOR MUSCLE SPASMS OR PAIN.    metoprolol tartrate  (LOPRESSOR) 25 mg Oral Tablet Take 1 Tablet (25 mg total) by mouth    mirabegron (MYRBETRIQ) 50 mg Oral Tablet Sustained Release 24 hr Take 1 Tablet (50 mg total) by mouth Once a day    mupirocin (BACTROBAN) 2 % Ointment Apply 1 Application topically    naloxone (NARCAN) 4 mg per spray nasal spray INHALE 1 SPRAY (4 MG) INTO NOSTRIL ONE TIME IF NEEDED    naproxen sodium (ANAPROX) 550 mg Oral Tablet Take 1 Tablet (550 mg total) by mouth    nitroGLYCERIN (NITROSTAT) 0.4 mg Sublingual Tablet, Sublingual PLACE 1 TABLET UNDER TONGUE EVERY 5 MINS, UP TO 3 DOSES AS NEEDED FOR CHEST PAIN    nystatin (NYSTOP) 100,000 unit/gram Powder Apply topically    ondansetron (ZOFRAN) 8 mg Oral Tablet     Oxycodone (ROXICODONE) 10 mg Oral Tablet Take 1 Tablet (10 mg total) by mouth    OZEMPIC 1 mg/dose (4 mg/3 mL) Subcutaneous Pen Injector INJECT 1 MG SUBCUTANEOUSLY WEEKLY    Pen Needle, Disposable, 31 gauge x 3/16" Needle Inject 1 Each under the skin    phentermine (ADIPEX-P) 37.5 mg Oral Tablet Take 1 Tablet (37.5 mg total) by mouth    prednisoLONE acetate (PRED FORTE) 1 % Ophthalmic Drops, Suspension     predniSONE (DELTASONE) 10 mg Oral Tablet TAKE 1 TABLET BY MOUTH TWICE A DAY AS NEEDED FOR LUPUS FLARE    prochlorperazine (COMPAZINE) 10 mg Oral Tablet Take 1 Tablet (10 mg total) by mouth    RESTASIS MULTIDOSE 0.05 % Ophthalmic Drops USE 1 DROP IN EACH EYE TWICE A DAY    riboflavin, vitamin B2, (VITAMIN B-2) 100 mg Oral Tablet Take 1 Tablet (100 mg total) by mouth    rizatriptan (MAXALT) 10 mg Oral Tablet TAKE 1 TABLET AS NEEDED ON ONSET OF HEADACHE, MAY REPEAT AFTER 2 HOURS UP TO 2 IN 24 HOURS    rOPINIRole (REQUIP) 1 mg Oral Tablet Take 1-2 Tablets (1-2 mg total) by mouth    silver sulfADIAZINE (SILVADENE) 1 % Cream     solifenacin (VESICARE) 10 mg Oral Tablet Take 1 Tablet (10 mg total) by mouth Once a day    SYMBICORT 160-4.5 mcg/actuation Inhalation oral inhaler 2 Puffs Twice daily    triamcinolone acetonide 0.1 % Cream APPLY TO  AFFECTED AREA TWICE A DAY FOR 2WEEKS OUT OF THE MONTH OR LESS    TYMLOS 80 mcg (3,120 mcg/1.56 mL) Subcutaneous Pen Injector Inject 80 mcg under the skin    vitamin A (AQUASOL A) 3,000 mcg (10,000 unit) Oral Capsule Take 1 Capsule (10,000 Units total) by mouth Once a day 1 a day    vitamin E 400 unit Oral Capsule Take 1 Capsule (400 Units total) by mouth Once a day       Allergies   Allergen Reactions    Morphine Anaphylaxis    Augmentin [Amoxicillin-Pot Clavulanate]     Azithromycin     Celebrex [Celecoxib]     Cephalosporins     Chlorhexidine [place]lette     Clavulanic Acid     Codeine  Cymbalta [Duloxetine]     Penicillins     Tramadol      PHYSICAL EXAM:  BP 123/83 (Site: Left Arm, Patient Position: Sitting, Cuff Size: Adult)   Pulse 80   Temp 36.7 C (98 F) (Temporal)   Ht 1.702 m (5\' 7" )   Wt 89.8 kg (198 lb)   SpO2 (!) 80%   BMI 31.01 kg/m      ECOG Status: (3) Capable of limited self-care, confined to bed or chair > 50% of waking hours   Physical Exam  HENT:      Mouth/Throat:      Mouth: Mucous membranes are moist.      Pharynx: Oropharynx is clear.   Eyes:      Extraocular Movements: Extraocular movements intact.   Cardiovascular:      Rate and Rhythm: Normal rate and regular rhythm.      Pulses: Normal pulses.      Heart sounds: Normal heart sounds.   Pulmonary:      Effort: Pulmonary effort is normal.      Breath sounds: Normal breath sounds.   Abdominal:      Palpations: Abdomen is soft.   Musculoskeletal:      Cervical back: Normal range of motion and neck supple.   Skin:         Neurological:      Mental Status: She is alert.        LABS:     CBC  Diff   Lab Results   Component Value Date/Time    WBC 8.1 01/31/2023 12:53 PM    HGB 15.3 (H) 01/31/2023 12:53 PM    HCT 45.8 (H) 01/31/2023 12:53 PM    PLTCNT 259 01/31/2023 12:53 PM    RBC 4.84 01/31/2023 12:53 PM    MCV 94.6 01/31/2023 12:53 PM    MCHC 33.4 01/31/2023 12:53 PM    MCH 31.6 01/31/2023 12:53 PM    RDW 12.6 01/31/2023 12:53 PM    MPV  8.0 01/31/2023 12:53 PM    Lab Results   Component Value Date/Time    PMNS 71 01/31/2023 12:53 PM    LYMPHOCYTES 18 01/31/2023 12:53 PM    EOSINOPHIL 3 01/31/2023 12:53 PM    MONOCYTES 8 01/31/2023 12:53 PM    BASOPHILS 0 01/31/2023 12:53 PM    BASOPHILS 0.00 01/31/2023 12:53 PM    PMNABS 5.70 01/31/2023 12:53 PM    LYMPHSABS 1.40 01/31/2023 12:53 PM    EOSABS 0.20 01/31/2023 12:53 PM    MONOSABS 0.60 01/31/2023 12:53 PM        Comprehensive Metabolic Profile    Lab Results   Component Value Date    SODIUM 142 06/20/2023    POTASSIUM 5.0 06/20/2023    CHLORIDE 104 06/20/2023    CO2 23 06/20/2023    ANIONGAP 15 (H) 06/20/2023    BUN 20 06/20/2023    CREATININE 1.21 06/20/2023    ALBUMIN 3.9 06/20/2023    CALCIUM 9.7 06/20/2023    GLUCOSENF 105 06/20/2023    ALKPHOS 89 06/20/2023    ALT 15 06/20/2023    AST 19 06/20/2023    TOTBILIRUBIN 0.7 06/20/2023    TOTALPROTEIN 6.2 (L) 06/20/2023     ESTIMATED GFR   Date Value Ref Range Status   06/20/2023 55 (L) >59 mL/min/1.71m^2 Final     IRON   Date Value Ref Range Status   06/20/2023 100 50 - 212 ug/dL Final     FERRITIN   Date  Value Ref Range Status   01/31/2023 43 11 - 336 ng/mL Final     TOTAL IRON BINDING CAPACITY   Date Value Ref Range Status   06/20/2023 391 250 - 450 ug/dL Final     UIBC   Date Value Ref Range Status   06/20/2023 291 130 - 375 ug/dL Final     IRON (TRANSFERRIN) SATURATION   Date Value Ref Range Status   06/20/2023 26 15 - 50 % Final     VITAMIN B 12   Date Value Ref Range Status   07/17/2022 883 180 - 914 pg/mL Final       ASSESSMENT:    ICD-10-CM    1. Hypothyroidism, unspecified type  E03.9 THYROID STIMULATING HORMONE WITH FREE T4 REFLEX      2. Vitamin B 12 deficiency  E53.8 VITAMIN B12      3. Vitamin D deficiency  E55.9 VITAMIN D 25 TOTAL      4. Hypomagnesemia  E83.42 MAGNESIUM            PLAN:   Patient will have labs completed today as ordered. She will continue her current medications as prescribed and follow up with all of her other  providers as scheduled. She will follow up with [place] in 6 months or sooner if needed.       Abigail Carlson was given the chance to ask questions, and these were answered to their satisfaction. The patient is welcome to call with any questions or concerns in the meantime.     On the day of the encounter, a total of  34 minutes was spent on this patient encounter including review of historical information, examination, documentation and post-visit activities.   Return in about 6 months (around 12/21/2023).     Benjaman Pott, APRN,FNP-BC ,06/20/2023 ,14:07     CC:  Norval Gable, NP  [address]  [place] [address]    Norval Gable, NP  [address]  [address]  [place],  [place] [address]      This note was partially generated using MModal Fluency Direct system, and there may be some incorrect words, spellings, and punctuation that were not noted in checking the note before saving.

## 2023-06-20 NOTE — Addendum Note (Signed)
 Addended by: Benjaman Pott ANN on: 06/20/2023 04:22 PM     Modules accepted: Orders

## 2023-06-21 ENCOUNTER — Ambulatory Visit (HOSPITAL_COMMUNITY)

## 2023-06-25 ENCOUNTER — Encounter (HOSPITAL_COMMUNITY): Payer: Self-pay

## 2023-06-25 ENCOUNTER — Other Ambulatory Visit: Payer: Self-pay

## 2023-06-25 ENCOUNTER — Ambulatory Visit
Admission: RE | Admit: 2023-06-25 | Discharge: 2023-06-25 | Disposition: A | Discharge status: Home / Self Care | Source: Ambulatory Visit | Attending: NURSE PRACTITIONER | Admitting: NURSE PRACTITIONER

## 2023-06-25 ENCOUNTER — Other Ambulatory Visit (INDEPENDENT_AMBULATORY_CARE_PROVIDER_SITE_OTHER): Payer: Self-pay | Admitting: NURSE PRACTITIONER

## 2023-06-25 ENCOUNTER — Encounter (INDEPENDENT_AMBULATORY_CARE_PROVIDER_SITE_OTHER): Payer: Self-pay | Admitting: NURSE PRACTITIONER

## 2023-06-25 DIAGNOSIS — N1832 Chronic kidney disease, stage 3b: Secondary | ICD-10-CM | POA: Insufficient documentation

## 2023-06-25 LAB — MAGNESIUM
MAGNESIUM: 1.5 mg/dL — ABNORMAL LOW (ref 1.9–2.7)
MAGNESIUM: 3.3 mg/dL — ABNORMAL HIGH (ref 1.9–2.7)

## 2023-06-25 MED ORDER — MAGNESIUM SULFATE 1 GRAM/100 ML IN D5W IVPB PREMIX -Q30MIN X4 DEFAULT
4.0000 g | INJECTION | Freq: Once | INTRAVENOUS | Status: AC
Start: 2023-06-25 — End: 2023-06-25
  Administered 2023-06-25: 4 g via INTRAVENOUS
  Administered 2023-06-25: 0 g via INTRAVENOUS
  Filled 2023-06-25: qty 100

## 2023-06-25 NOTE — Nurses Notes (Addendum)
 0948-Patient to room by wheelchair.  Patient has left AKA.  Patient has no complaints today.  Lung sounds are clear and normal heart sounds present.  Vitals are stable.Salli Quarry, RN  1000-Blood collected for serum magnesium level from right [facility] and sent to lab.Salli Quarry, RN  1050-Peripheral IV placed in right hand and blood return obtained.Salli Quarry, RN  1101-Magnesium infusion started (4 grams total).Salli Quarry, RN  1301-Magnesium infusion completed.Salli Quarry, RN  1356-Additional magnesium level drawn by lab tech.Salli Quarry, RN  1440-Peripheral IV removed with catheter intact.  Blood return obtained prior to Janeece Riggers, RN  1443-Patient checking out now.  Vitals are stable.Salli Quarry, RN

## 2023-09-03 ENCOUNTER — Encounter (INDEPENDENT_AMBULATORY_CARE_PROVIDER_SITE_OTHER): Payer: Self-pay

## 2023-09-18 ENCOUNTER — Other Ambulatory Visit (HOSPITAL_COMMUNITY): Payer: Self-pay | Admitting: NURSE PRACTITIONER

## 2023-09-18 DIAGNOSIS — D509 Iron deficiency anemia, unspecified: Secondary | ICD-10-CM

## 2023-12-04 ENCOUNTER — Encounter (INDEPENDENT_AMBULATORY_CARE_PROVIDER_SITE_OTHER): Admitting: Physician Assistant

## 2023-12-31 ENCOUNTER — Other Ambulatory Visit (INDEPENDENT_AMBULATORY_CARE_PROVIDER_SITE_OTHER): Payer: Self-pay | Admitting: NURSE PRACTITIONER

## 2023-12-31 DIAGNOSIS — D509 Iron deficiency anemia, unspecified: Secondary | ICD-10-CM

## 2024-01-01 ENCOUNTER — Encounter (INDEPENDENT_AMBULATORY_CARE_PROVIDER_SITE_OTHER): Payer: Self-pay | Admitting: Physician Assistant

## 2024-01-01 ENCOUNTER — Encounter (INDEPENDENT_AMBULATORY_CARE_PROVIDER_SITE_OTHER): Admitting: Physician Assistant

## 2024-01-01 ENCOUNTER — Other Ambulatory Visit: Payer: Self-pay

## 2024-01-01 VITALS — BP 106/73 | HR 77 | Ht 64.0 in | Wt 165.0 lb

## 2024-01-01 DIAGNOSIS — R351 Nocturia: Secondary | ICD-10-CM

## 2024-01-01 DIAGNOSIS — N393 Stress incontinence (female) (male): Secondary | ICD-10-CM

## 2024-01-01 DIAGNOSIS — N3281 Overactive bladder: Secondary | ICD-10-CM

## 2024-01-01 DIAGNOSIS — Z87442 Personal history of urinary calculi: Secondary | ICD-10-CM

## 2024-01-01 DIAGNOSIS — N3941 Urge incontinence: Secondary | ICD-10-CM

## 2024-01-01 DIAGNOSIS — N3946 Mixed incontinence: Secondary | ICD-10-CM

## 2024-01-01 MED ORDER — MIRABEGRON ER 50 MG TABLET,EXTENDED RELEASE 24 HR
50.0000 mg | ORAL_TABLET | Freq: Every day | ORAL | 3 refills | Status: AC
Start: 2024-01-01 — End: ?

## 2024-01-01 MED ORDER — SOLIFENACIN 10 MG TABLET
10.0000 mg | ORAL_TABLET | Freq: Every day | ORAL | 3 refills | Status: AC
Start: 2024-01-01 — End: ?

## 2024-01-01 NOTE — Progress Notes (Signed)
 [facility]  UROLOGY, [facility]    Progress Note    Name: Abigail Carlson MRN:  [id number]   Date: 01/01/2024 Age: 48 y.o.       Chief Complaint: Follow Up    Subjective:     Mrs. 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  She is on Myrbetriq  50 mg for her overactive bladder in his happy with the results.  She has to wear 0-1 pads per day.  She does not have significant bother from her incontinence.  She reports a history of kidney stones. She denies fevers, chills, nausea, vomiting, hematuria, dysuria, flank pain, incontinence, dribbling, hesitancy, suprapubic pain, headaches, vision changes, shortness of breath, chest pain.    Today, patient presents to the clinic for OAB and nocturia on vesicare  10 mg and myrbetriq  50 mg. She underwent spinal fusion in March  2025 with improvement in urinary incontinence and urgency. Patient reports nocturia now 2-3 episodes; previously q 2h. Patient reports continues to experience symptom control during the day, but with some leaking requiring  2-3 depends and 0-1 pads daily. Patient reports continued nocturia much improved and using up to 5-6 pads, previously up to 10 pads.          Objective :  BP 106/73   Pulse 77   Ht 1.626 m (5' 4)   Wt 74.8 kg (165 lb)   BMI 28.32 kg/m       Gen: NAD, alert  Pulm: unlabored at rest  CV: palpable pulses  Abd: soft, Nt/ND  GU: no suprapubic tenderness, no CVAT    Data reviewed:    Current Outpatient Medications   Medication Sig    acetaminophen (TYLENOL) 500 mg Oral Tablet Take 2 Tablets (1,000 mg total) by mouth    albuterol sulfate (PROVENTIL OR VENTOLIN OR PROAIR) 90 mcg/actuation Inhalation oral inhaler Take 1-2 Puffs by inhalation Every 6 hours as needed    Alcohol Swabs Pads, Medicated Apply 1 Swab topically    ALPRAZolam (XANAX) 0.5 mg Oral Tablet Take 1 Tablet (0.5 mg total) by mouth Every night as needed for Insomnia    ascorbic acid,  vitamin C, (VITAMIN C) 250 mg Oral Tablet Take 1 Tablet (250 mg total) by mouth    aspirin 81 mg Oral Tablet, Chewable Chew 1 Tablet (81 mg total) Once a day    Blood Sugar Diagnostic Strip 1 Strip    buPROPion (WELLBUTRIN XL) 150 mg extended release 24 hr tablet Take 2 Tablets (300 mg total) by mouth    calcium carbonate-vitamin D3 500 mg-5 mcg (200 unit) Oral Tablet Take 1 Tablet by mouth    cholecalciferol , vitamin D3, 25 mcg (1,000 unit) Oral Tablet Take 1 Tablet (1,000 Units total) by mouth Once a day    citalopram (CELEXA) 20 mg Oral Tablet Take 1 Tablet (20 mg total) by mouth Once a day    clobetasoL (TEMOVATE) 0.05 % Cream APPLY TWICE A DAY TO RASH ON ARMS X 1 MONTH. DO NOT APPLY TO FACE.    Dexlansoprazole (DEXILANT) 60 mg Oral Cap, Delayed Rel., Multiphasic Take 1 Capsule (60 mg total) by mouth Once a day    Diaper,Brief, Adult,Disposable Misc     diclofenac sodium (VOLTAREN) 1 % Gel APPLY TO AFFECTED AREA TWICE DAILY AS NEEDED    diphenoxylate-atropine (LOMOTIL) 2.5-0.025 mg Oral Tablet     EPINEPHrine 0.3 mg/0.3 mL Injection Auto-Injector USE AS DIRECTED  AS NEEDED    ergocalciferol , vitamin D2, (DRISDOL) 1,250 mcg (50,000 unit) Oral Capsule TAKE 1 CAPSULE BY MOUTH 2 TIMES WEEKLY    ferrous sulfate  (FEOSOL) 325 mg (65 mg iron ) Oral Tablet TAKE 1 TABLET BY MOUTH EVERY OTHER DAY    gabapentin (NEURONTIN) 600 mg Oral Tablet Take 1 Tablet (600 mg total) by mouth Three times a day    hydrocortisone 2.5 % Cream two times daily .    hydroxychloroquine (PLAQUENIL) 200 mg Oral Tablet Take 1 Tablet (200 mg total) by mouth Once a day    Levetiracetam 500 mg Oral Tablet Sustained Release 24 hr Take 1 Tablet (500 mg total) by mouth Every night    magnesium  oxide (MAG-OX) 400 mg Oral Tablet Take 1 Tablet (400 mg total) by mouth Once a day    melatonin 3 mg Oral Tablet Take 10 mg by mouth    methocarbamoL (ROBAXIN) 500 mg Oral Tablet TAKE 1 TABLET FOUR TIMES DAILY AS NEEDED BY MOUTH FOR MUSCLE SPASMS OR PAIN.     metoprolol tartrate (LOPRESSOR) 25 mg Oral Tablet Take 1 Tablet (25 mg total) by mouth    mirabegron  (MYRBETRIQ ) 50 mg Oral Tablet Sustained Release 24 hr Take 1 Tablet (50 mg total) by mouth Once a day    mupirocin (BACTROBAN) 2 % Ointment Apply 1 Application topically    naloxone (NARCAN) 4 mg per spray nasal spray INHALE 1 SPRAY (4 MG) INTO NOSTRIL ONE TIME IF NEEDED    naproxen sodium (ANAPROX) 550 mg Oral Tablet Take 1 Tablet (550 mg total) by mouth    nitroGLYCERIN (NITROSTAT) 0.4 mg Sublingual Tablet, Sublingual PLACE 1 TABLET UNDER TONGUE EVERY 5 MINS, UP TO 3 DOSES AS NEEDED FOR CHEST PAIN    nystatin (NYSTOP) 100,000 unit/gram Powder Apply topically    ondansetron (ZOFRAN) 8 mg Oral Tablet     Oxycodone (ROXICODONE) 10 mg Oral Tablet Take 1 Tablet (10 mg total) by mouth (Patient taking differently: Take 1.5 Tablets (15 mg total) by mouth)    OZEMPIC 1 mg/dose (4 mg/3 mL) Subcutaneous Pen Injector INJECT 1 MG SUBCUTANEOUSLY WEEKLY    Pen Needle, Disposable, 31 gauge x 3/16 Needle Inject 1 Each under the skin    phentermine (ADIPEX-P) 37.5 mg Oral Tablet Take 1 Tablet (37.5 mg total) by mouth    prednisoLONE acetate (PRED FORTE) 1 % Ophthalmic Drops, Suspension     predniSONE (DELTASONE) 10 mg Oral Tablet TAKE 1 TABLET BY MOUTH TWICE A DAY AS NEEDED FOR LUPUS FLARE    prochlorperazine (COMPAZINE) 10 mg Oral Tablet Take 1 Tablet (10 mg total) by mouth    RESTASIS MULTIDOSE 0.05 % Ophthalmic Drops USE 1 DROP IN EACH EYE TWICE A DAY    riboflavin, vitamin B2, (VITAMIN B-2) 100 mg Oral Tablet Take 1 Tablet (100 mg total) by mouth    rizatriptan (MAXALT) 10 mg Oral Tablet TAKE 1 TABLET AS NEEDED ON ONSET OF HEADACHE, MAY REPEAT AFTER 2 HOURS UP TO 2 IN 24 HOURS    rOPINIRole (REQUIP) 1 mg Oral Tablet Take 1-2 Tablets (1-2 mg total) by mouth    silver sulfADIAZINE (SILVADENE) 1 % Cream     solifenacin  (VESICARE ) 10 mg Oral Tablet Take 1 Tablet (10 mg total) by mouth Once a day    SYMBICORT 160-4.5 mcg/actuation  Inhalation oral inhaler 2 Puffs Twice daily    triamcinolone acetonide 0.1 % Cream APPLY TO AFFECTED AREA TWICE A DAY FOR 2WEEKS OUT OF THE MONTH OR LESS  TYMLOS 80 mcg (3,120 mcg/1.56 mL) Subcutaneous Pen Injector Inject 80 mcg under the skin    vitamin A (AQUASOL A) 3,000 mcg (10,000 unit) Oral Capsule Take 1 Capsule (10,000 Units total) by mouth Once a day 1 a day    vitamin E 400 unit Oral Capsule Take 1 Capsule (400 Units total) by mouth Once a day     Assessment/Plan  Problem List Items Addressed This Visit    None    Overactive bladder with Urge urinary incontinence on myrebtriq 50 mg  I discussed the differential diagnosis, pathophysiology and nature of patient's overactive bladder/urge urinary incontinence  I also counseled patient on conservative management options including appropriate fluid management, avoidance of diuretics including caffeine and alcohol, weight loss (if applicable), dedicated pelvic floor muscle therapy and Kegel's exercises  Refill provided for Mirabegron  (MyrbetriqT) 50 mg P.O. daily:    Refill Solifenacin  succinate (VESIcareT) 10 mg P.O. daily:          Stress urinary incontinence requiring  2-3 depends and 0-1 pads daytime and  using up to 5-6 pads at night.   We discussed in detail the nature and pathophysiology of bothersome stress urinary incontinence with her today which are consistent with urethral hypermobility.  Given the bothersome nature of her symptoms, we discussed potential treatment options including:  Conservative management, including pelvic floor Kegel exercises, which is generally reserved for mild cases  Estrogen replacement therapy either primarily or as an adjunct, when not contraindicated, to restore blood flow to the female pelvic organs to aid in preserving vascularity to her pelvic floor  Urethral bulking agents  Synthetic midurethral sling delivered either a retropubic or transobturator approach  Bladder neck sling utilizing autologous fascia  We also  discussed the incidence, nature and risks of de novo urge urinary incontinence following treatment of stress urinary incontinence, particularly following midurethral sling        History of nephrolithiasis  I discussed the differential diagnosis, pathophysiology and nature of urolithiasis and recurrent stone disease.  We also reviewed the recent 2014 American Urological Association guideline on Medical Management of Kidney Stones and specifically discussed dietary therapies including:  Increase fluid intake to achieve urine output of at least 2.5 liters daily  Limit sodium intake to no more than 100 mEq (2,300 mg) per day  Consume 1,000-1,200 mg of dietary calcium per day  Limit oxalate-rich foods (beets, spinach, rhubarb, nuts, chocolate)  Limit non-dairy animal protein  Encouraged increased fruit and vegetable intake

## 2024-01-02 ENCOUNTER — Ambulatory Visit: Payer: Self-pay | Attending: NURSE PRACTITIONER | Admitting: NURSE PRACTITIONER

## 2024-01-02 ENCOUNTER — Other Ambulatory Visit (INDEPENDENT_AMBULATORY_CARE_PROVIDER_SITE_OTHER): Payer: Self-pay | Admitting: NURSE PRACTITIONER

## 2024-01-02 ENCOUNTER — Encounter (HOSPITAL_COMMUNITY): Payer: Self-pay | Admitting: NURSE PRACTITIONER

## 2024-01-02 ENCOUNTER — Encounter (INDEPENDENT_AMBULATORY_CARE_PROVIDER_SITE_OTHER): Payer: Self-pay | Admitting: NURSE PRACTITIONER

## 2024-01-02 ENCOUNTER — Ambulatory Visit (INDEPENDENT_AMBULATORY_CARE_PROVIDER_SITE_OTHER)
Admission: RE | Admit: 2024-01-02 | Discharge: 2024-01-02 | Disposition: A | Discharge status: Home / Self Care | Payer: Self-pay | Source: Ambulatory Visit | Attending: NURSE PRACTITIONER | Admitting: NURSE PRACTITIONER

## 2024-01-02 ENCOUNTER — Ambulatory Visit (INDEPENDENT_AMBULATORY_CARE_PROVIDER_SITE_OTHER): Payer: Self-pay | Admitting: NURSE PRACTITIONER

## 2024-01-02 VITALS — BP 114/71 | HR 83 | Temp 98.6°F | Ht 64.0 in | Wt 185.1 lb

## 2024-01-02 DIAGNOSIS — Z89612 Acquired absence of left leg above knee: Secondary | ICD-10-CM | POA: Insufficient documentation

## 2024-01-02 DIAGNOSIS — Z79899 Other long term (current) drug therapy: Secondary | ICD-10-CM | POA: Insufficient documentation

## 2024-01-02 DIAGNOSIS — G8928 Other chronic postprocedural pain: Secondary | ICD-10-CM | POA: Insufficient documentation

## 2024-01-02 DIAGNOSIS — D509 Iron deficiency anemia, unspecified: Secondary | ICD-10-CM

## 2024-01-02 LAB — MAGNESIUM: MAGNESIUM: 1.5 mg/dL — ABNORMAL LOW (ref 1.9–2.7)

## 2024-01-02 LAB — IRON TRANSFERRIN AND TIBC
IRON (TRANSFERRIN) SATURATION: 13 % — ABNORMAL LOW (ref 15–50)
IRON: 44 ug/dL — ABNORMAL LOW (ref 50–212)
TOTAL IRON BINDING CAPACITY: 340 ug/dL (ref 250–450)
TRANSFERRIN: 243 mg/dL (ref 203–362)
UIBC: 296 ug/dL (ref 130–375)

## 2024-01-02 LAB — COMPREHENSIVE METABOLIC PANEL, NON-FASTING
ALBUMIN/GLOBULIN RATIO: 1.5 — ABNORMAL HIGH (ref 0.8–1.4)
ALBUMIN: 3.4 g/dL — ABNORMAL LOW (ref 3.5–5.7)
ALKALINE PHOSPHATASE: 60 U/L (ref 34–104)
ALT (SGPT): 20 U/L (ref 7–52)
ANION GAP: 3 mmol/L — ABNORMAL LOW (ref 4–13)
AST (SGOT): 22 U/L (ref 13–39)
BILIRUBIN TOTAL: 0.5 mg/dL (ref 0.3–1.0)
BUN/CREA RATIO: 17 (ref 6–22)
BUN: 18 mg/dL (ref 7–25)
CALCIUM, CORRECTED: 9.1 mg/dL (ref 8.9–10.8)
CALCIUM: 8.6 mg/dL (ref 8.6–10.3)
CHLORIDE: 105 mmol/L (ref 98–107)
CO2 TOTAL: 34 mmol/L — ABNORMAL HIGH (ref 21–31)
CREATININE: 1.06 mg/dL (ref 0.60–1.30)
ESTIMATED GFR: 64 mL/min/1.73mˆ2 (ref >59)
GLOBULIN: 2.2 (ref 2.0–3.5)
GLUCOSE: 72 mg/dL — ABNORMAL LOW (ref 74–109)
OSMOLALITY, CALCULATED: 284 mosm/kg (ref 270–290)
POTASSIUM: 4.2 mmol/L (ref 3.5–5.1)
PROTEIN TOTAL: 5.6 g/dL — ABNORMAL LOW (ref 6.4–8.9)
SODIUM: 142 mmol/L (ref 136–145)

## 2024-01-02 LAB — CBC WITH DIFF
BASOPHIL #: 0 x10ˆ3/uL (ref 0.00–0.10)
BASOPHIL %: 0 % (ref 0–1)
EOSINOPHIL #: 0.1 x10ˆ3/uL (ref 0.00–0.50)
EOSINOPHIL %: 2 % (ref 1–7)
HCT: 39 % (ref 31.2–41.9)
HGB: 13.1 g/dL (ref 10.9–14.3)
LYMPHOCYTE #: 1.2 x10ˆ3/uL (ref 1.10–3.10)
LYMPHOCYTE %: 14 % — ABNORMAL LOW (ref 16–46)
MCH: 30.5 pg (ref 24.7–32.8)
MCHC: 33.7 g/dL (ref 32.3–35.6)
MCV: 90.6 fL (ref 75.5–95.3)
MONOCYTE #: 0.4 x10ˆ3/uL (ref 0.20–0.90)
MONOCYTE %: 5 % (ref 4–11)
MPV: 8 fL (ref 7.9–10.8)
NEUTROPHIL #: 7.1 x10ˆ3/uL (ref 1.90–8.20)
NEUTROPHIL %: 80 % — ABNORMAL HIGH (ref 43–77)
PLATELETS: 246 x10ˆ3/uL (ref 140–440)
RBC: 4.31 x10ˆ6/uL (ref 3.63–4.92)
RDW: 15.8 % (ref 12.3–17.7)
WBC: 8.9 x10ˆ3/uL (ref 3.8–11.8)

## 2024-01-02 LAB — FERRITIN: FERRITIN: 13 ng/mL (ref 11–307)

## 2024-01-02 NOTE — Cancer Center Note (Signed)
 Abigail Carlson  [id number]  [birth year]   01/02/2024       Department of Hematology/Oncology  Return Patient Visit           REFERRING PROVIDER:  Alton Joen CROME, NP  [address]  [address]  [place],  [place] [address]      REASON FOR OFFICE VISIT:  Ongoing management and evaluation of  iron  deficiency anemia.       HISTORY OF PRESENT ILLNESS:  History of Present Illness  Abigail Carlson is a 47 year old female who presents for follow-up after back surgery and evaluation of dizziness and cramping.    She recently underwent back surgery, which required a longer hospital stay than anticipated, lasting a week instead of a few days. Post-surgery, she has experienced some improvement in bladder function and a reduction in pain, although some pain persists.    Over the past couple of weeks, she has experienced dizziness and cramping. She is unsure of the cause of the dizziness. Recent lab results show a white blood cell count of 8.9, hemoglobin of 13.1, platelet count of 246,000, and an iron  level of 13%. She has been consuming a lot of watermelon recently.    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 She can walk short distances but not long distances. She recently received a new prosthetic leg, which fits tightly and requires hand sanitizer to put on and remove. She often does not wear it around the house due to its bulkiness.    No chest pain, breathing difficulties, or new lumps or bumps.          Past Medical History:   Diagnosis Date    AKA stump complication (CMS HCC)     Anemia     Anxiety state     Asthma     Carpal tunnel syndrome     Depression     Diabetes mellitus, type 2     Esophageal reflux     Fibromyalgia     Hiatal hernia     History of IBS     Hypovitaminosis D     Kidney stones     Migraines     Osteoarthritis     PTSD (post-traumatic stress disorder)     RLS (restless legs syndrome)     SCC (squamous cell carcinoma)     Systemic lupus erythematosus            Past Surgical History:    Procedure Laterality Date    HX APPENDECTOMY      HX CERVICAL DISKECTOMY      HX CHOLECYSTECTOMY      HX GASTRIC BYPASS      HX TONSILLECTOMY      LAMINECTOMY      LEG AMPUTATION Left     REPLACEMENT TOTAL KNEE BILATERAL      SPINAL FUSION             Social History     Socioeconomic History    Marital status: Single     Spouse name: Not on file    Number of children: Not on file    Years of education: Not on file    Highest education level: Not on file   Occupational History    Not on file   Tobacco Use    Smoking status: Never    Smokeless tobacco: Never   Vaping Use    Vaping status: Never Used   Substance and  Sexual Activity    Alcohol use: Never    Drug use: Never    Sexual activity: Not on file   Other Topics Concern    Not on file   Social History Narrative    Not on file     Social Determinants of Health     Financial Resource Strain: Low Risk  (07/02/2023)    Received from [facility]    Overall Financial Resource Strain (CARDIA)     Difficulty of Paying Living Expenses: Not hard at all   Transportation Needs: No Transportation Needs (07/02/2023)    Received from [facility] - Transportation     Lack of Transportation (Medical): No     Lack of Transportation (Non-Medical): No   Social Connections: Moderately Isolated (07/02/2023)    Received from [facility]    Social Connection and Isolation Panel     In a typical week, how many times do you talk on the phone with family, friends, or neighbors?: More than three times a week     Frequency of Social Gatherings with Friends and Family: Not on file     How often do you attend church or religious services?: More than 4 times per year     Do you belong to any clubs or organizations such as church groups, unions, fraternal or athletic groups, or school groups?: No     How often do you attend meetings of the clubs or organizations you belong to?: Never     Are you married, widowed, divorced, separated, never married, or living with a  partner?: Never married   Intimate Partner Violence: Not At Risk (07/02/2023)    Received from [facility]    Humiliation, Afraid, Rape, and Kick questionnaire     Within the last year, have you been afraid of your partner or ex-partner?: No     Within the last year, have you been humiliated or emotionally abused in other ways by your partner or ex-partner?: No     Within the last year, have you been kicked, hit, slapped, or otherwise physically hurt by your partner or ex-partner?: No     Within the last year, have you been raped or forced to have any kind of sexual activity by your partner or ex-partner?: No   Housing Stability: Unknown (07/02/2023)    Received from [facility] Stability Vital Sign     In the last 12 months, was there a time when you were not able to pay the mortgage or rent on time?: No     Number of Times Moved in the Last Year: Not on file     At any time in the past 12 months, were you homeless or living in a shelter (including now)?: No       Social History     Social History Narrative    Not on file       Social History     Substance and Sexual Activity   Drug Use Never       Family Medical History:       Problem Relation (Age of Onset)    Elevated Lipids Father    Heart Disease Father    Hypertension (High Blood Pressure) Mother    Kidney Cancer Mother    Migraines Mother              Current Outpatient Medications   Medication Sig    acetaminophen (TYLENOL) 500  mg Oral Tablet Take 2 Tablets (1,000 mg total) by mouth    albuterol sulfate (PROVENTIL OR VENTOLIN OR PROAIR) 90 mcg/actuation Inhalation oral inhaler Take 1-2 Puffs by inhalation Every 6 hours as needed    Alcohol Swabs Pads, Medicated Apply 1 Swab topically    ALPRAZolam (XANAX) 0.5 mg Oral Tablet Take 1 Tablet (0.5 mg total) by mouth Every night as needed for Insomnia    ascorbic acid, vitamin C, (VITAMIN C) 250 mg Oral Tablet Take 1 Tablet (250 mg total) by mouth    aspirin 81 mg Oral Tablet, Chewable Chew 1  Tablet (81 mg total) Once a day    Blood Sugar Diagnostic Strip 1 Strip    buPROPion (WELLBUTRIN XL) 150 mg extended release 24 hr tablet Take 2 Tablets (300 mg total) by mouth    cholecalciferol , vitamin D3, 25 mcg (1,000 unit) Oral Tablet Take 1 Tablet (1,000 Units total) by mouth Once a day    citalopram (CELEXA) 20 mg Oral Tablet Take 1 Tablet (20 mg total) by mouth Once a day    clobetasoL (TEMOVATE) 0.05 % Cream APPLY TWICE A DAY TO RASH ON ARMS X 1 MONTH. DO NOT APPLY TO FACE.    Dexlansoprazole (DEXILANT) 60 mg Oral Cap, Delayed Rel., Multiphasic Take 1 Capsule (60 mg total) by mouth Once a day    Diaper,Brief, Adult,Disposable Misc     diclofenac sodium (VOLTAREN) 1 % Gel APPLY TO AFFECTED AREA TWICE DAILY AS NEEDED    diphenoxylate-atropine (LOMOTIL) 2.5-0.025 mg Oral Tablet     EPINEPHrine 0.3 mg/0.3 mL Injection Auto-Injector USE AS DIRECTED AS NEEDED    ergocalciferol , vitamin D2, (DRISDOL) 1,250 mcg (50,000 unit) Oral Capsule TAKE 1 CAPSULE BY MOUTH 2 TIMES WEEKLY    ergocalciferol , vitamin D2, (DRISDOL) 1,250 mcg (50,000 unit) Oral Capsule Take 1 Capsule (50,000 Units total) by mouth Every MON and FRI    ferrous sulfate  (FEOSOL) 325 mg (65 mg iron ) Oral Tablet TAKE 1 TABLET BY MOUTH EVERY OTHER DAY    gabapentin (NEURONTIN) 600 mg Oral Tablet Take 1 Tablet (600 mg total) by mouth Three times a day    hydrocortisone 2.5 % Cream two times daily .    Levetiracetam 500 mg Oral Tablet Sustained Release 24 hr Take 1 Tablet (500 mg total) by mouth Every night    magnesium  oxide (MAG-OX) 400 mg Oral Tablet Take 1 Tablet (400 mg total) by mouth Once a day    melatonin 3 mg Oral Tablet Take 10 mg by mouth    metFORMIN (GLUCOPHAGE) 500 mg Oral Tablet TAKE 2 TABLETS BY MOUTH TWICE DAILY WITH A MEAL    methocarbamoL (ROBAXIN) 500 mg Oral Tablet TAKE 1 TABLET FOUR TIMES DAILY AS NEEDED BY MOUTH FOR MUSCLE SPASMS OR PAIN.    metoprolol tartrate (LOPRESSOR) 25 mg Oral Tablet Take 1 Tablet (25 mg total) by mouth     mirabegron  (MYRBETRIQ ) 50 mg Oral Tablet Sustained Release 24 hr Take 1 Tablet (50 mg total) by mouth Daily    mupirocin (BACTROBAN) 2 % Ointment Apply 1 Application topically    naloxone (NARCAN) 4 mg per spray nasal spray INHALE 1 SPRAY (4 MG) INTO NOSTRIL ONE TIME IF NEEDED    nitroGLYCERIN (NITROSTAT) 0.4 mg Sublingual Tablet, Sublingual PLACE 1 TABLET UNDER TONGUE EVERY 5 MINS, UP TO 3 DOSES AS NEEDED FOR CHEST PAIN    nystatin (NYSTOP) 100,000 unit/gram Powder Apply topically    ondansetron (ZOFRAN) 8 mg Oral Tablet  oxyCODONE (ROXICODONE) 15 mg Oral Tablet Take 1 Tablet (15 mg total) by mouth    OZEMPIC 1 mg/dose (4 mg/3 mL) Subcutaneous Pen Injector INJECT 1 MG SUBCUTANEOUSLY WEEKLY    Pen Needle, Disposable, 31 gauge x 3/16 Needle Inject 1 Each under the skin    phentermine (ADIPEX-P) 37.5 mg Oral Tablet Take 1 Tablet (37.5 mg total) by mouth    prednisoLONE acetate (PRED FORTE) 1 % Ophthalmic Drops, Suspension     predniSONE (DELTASONE) 10 mg Oral Tablet TAKE 1 TABLET BY MOUTH TWICE A DAY AS NEEDED FOR LUPUS FLARE    prochlorperazine (COMPAZINE) 10 mg Oral Tablet Take 1 Tablet (10 mg total) by mouth    RESTASIS MULTIDOSE 0.05 % Ophthalmic Drops USE 1 DROP IN EACH EYE TWICE A DAY    riboflavin, vitamin B2, (VITAMIN B-2) 100 mg Oral Tablet Take 1 Tablet (100 mg total) by mouth    rizatriptan (MAXALT) 10 mg Oral Tablet TAKE 1 TABLET AS NEEDED ON ONSET OF HEADACHE, MAY REPEAT AFTER 2 HOURS UP TO 2 IN 24 HOURS    rOPINIRole (REQUIP) 0.5 mg Oral Tablet TAKE 1 TABLET BY MOUTH EVERY DAY 1 TO 3 HOURS BEFORE BEDTIME    silver sulfADIAZINE (SILVADENE) 1 % Cream     solifenacin  (VESICARE ) 10 mg Oral Tablet Take 1 Tablet (10 mg total) by mouth Daily    SYMBICORT 160-4.5 mcg/actuation Inhalation oral inhaler 2 Puffs Twice daily    triamcinolone acetonide 0.1 % Cream APPLY TO AFFECTED AREA TWICE A DAY FOR 2WEEKS OUT OF THE MONTH OR LESS    TYMLOS 80 mcg (3,120 mcg/1.56 mL) Subcutaneous Pen Injector Inject 80 mcg  under the skin    vitamin A (AQUASOL A) 3,000 mcg (10,000 unit) Oral Capsule Take 1 Capsule (10,000 Units total) by mouth Once a day 1 a day    vitamin E 400 unit Oral Capsule Take 1 Capsule (400 Units total) by mouth Once a day       Allergies[1]      PHYSICAL EXAM:  BP 114/71 (Site: Left Arm, Patient Position: Sitting, Cuff Size: Adult)   Pulse 83   Temp 37 C (98.6 F) (Temporal)   Ht 1.626 m (5' 4)   Wt 84 kg (185 lb 1.6 oz)   SpO2 93%   BMI 31.77 kg/m        ECOG Status: (3) Capable of limited self-care, confined to bed or chair > 50% of waking hours   Physical Exam  HENT:      Mouth/Throat:      Mouth: Mucous membranes are moist.      Pharynx: Oropharynx is clear.   Eyes:      Extraocular Movements: Extraocular movements intact.   Cardiovascular:      Rate and Rhythm: Normal rate and regular rhythm.      Pulses: Normal pulses.      Heart sounds: Normal heart sounds.   Pulmonary:      Effort: Pulmonary effort is normal.      Breath sounds: Normal breath sounds.   Abdominal:      Palpations: Abdomen is soft.   Musculoskeletal:      Cervical back: Normal range of motion and neck supple.   Skin:       Neurological:      Mental Status: She is alert.           LABS:   Results  LABS  WBC: 8.9 (01/02/2024)  Hb: 13.1 (01/02/2024)  PLT: 246,000 (  01/02/2024)  Iron : 13% (01/02/2024)  K: 4.2 (01/02/2024)  CO2: 34 (01/02/2024)  Glucose: 72 (01/02/2024)       ASSESSMENT:  Assessment & Plan  Iron  deficiency anemia  Iron  deficiency anemia with low iron  level at 13%. Normal hemoglobin at 13.1 does not exclude deficiency.  - Order iron  supplementation.    Hypomagnesemia (suspected)  Suspected hypomagnesemia with pending magnesium  levels.  - Monitor magnesium  levels.  - Add magnesium  supplementation if low.    Status post left above knee amputation with prosthesis  Adapting to new prosthesis, tight fit, uses hand sanitizer for ease, cumbersome for home use.    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    Return in about 3 months (around 04/02/2024).     Abigail Carlson was given the chance to ask questions, and these were answered to their satisfaction. The patient is welcome to call with any questions or concerns in the meantime.     On the day of the encounter, a total of 30 minutes was spent on this patient encounter including review of historical information, examination, documentation and post-visit activities.   Arcenio Mullaly, APRN,FNP-BC ,01/02/2024 ,11:23     This note was partially generated using MModal Fluency Direct system, and there may be some incorrect words, spellings, and punctuation that were not noted in checking the note before saving.   This note was created using [employer] M*Modal Fluency Align mobile application via conversational audio. Consent for audio recording was obtained by patient/family members prior to recording.   CC:  Joen LITTIE Hazel, NP  [address]  [place] [address]                               [1]   Allergies  Allergen Reactions    Morphine Anaphylaxis    Augmentin [Amoxicillin-Pot Clavulanate]     Azithromycin     Celebrex [Celecoxib]     Cephalosporins     Chlorhexidine Towelette     Clavulanic Acid     Codeine     Cymbalta [Duloxetine]     Penicillins     Tramadol

## 2024-01-02 NOTE — Addendum Note (Signed)
 Addended by: UDELL SETTER ANN on: 01/02/2024 01:18 PM     Modules accepted: Orders

## 2024-01-03 ENCOUNTER — Encounter (HOSPITAL_COMMUNITY): Payer: Self-pay

## 2024-01-03 ENCOUNTER — Ambulatory Visit
Admission: RE | Admit: 2024-01-03 | Discharge: 2024-01-03 | Disposition: A | Discharge status: Home / Self Care | Source: Ambulatory Visit | Attending: NURSE PRACTITIONER | Admitting: NURSE PRACTITIONER

## 2024-01-03 ENCOUNTER — Other Ambulatory Visit: Payer: Self-pay

## 2024-01-03 ENCOUNTER — Encounter (HOSPITAL_COMMUNITY): Payer: Self-pay | Admitting: NURSE PRACTITIONER

## 2024-01-03 LAB — MAGNESIUM: MAGNESIUM: 3.5 mg/dL — ABNORMAL HIGH (ref 1.9–2.7)

## 2024-01-03 MED ORDER — MAGNESIUM SULFATE 1 GRAM/100 ML IN D5W IVPB PREMIX -Q30MIN X4 DEFAULT
4.0000 g | INJECTION | Freq: Once | INTRAVENOUS | Status: AC
Start: 2024-01-03 — End: 2024-01-03
  Administered 2024-01-03: 4 g via INTRAVENOUS
  Administered 2024-01-03: 0 g via INTRAVENOUS
  Filled 2024-01-03: qty 400

## 2024-01-03 NOTE — Nurses Notes (Signed)
 9177 through 1210: Patient arrived on unit to room 229B via wheelchair. Patient with known L AKA, prosthetic in use. VS obtained and are WNL for patient. Labs obtained prior to arrival. Magnesium  at 1.5 mg/dl. Orders reviewed and released. Magnesium  4 gms ordered to be infused via IV over 2 hours. IV placed in LAC (22g) without difficulty, see AVATAR. Magnesium  infusion began and patient monitored throughout. VS post infusion showed a lower BP than initial reading. Patient had stated that she had not taken a dose of her BP medication today. Education provided to not take medication today. Understanding verbalized. Lab entered to draw repeat BMP. Results shown improvement to 3.5 mg/dl. No further magnesium  needed at this time. Repeat VS taken prior to patient discharge. BP improved but still lower than initial BP (see flowsheet for VS documentation). Reinforced education to not take BP medication today. Patient verbalized understanding. IV removed with coban dressing placed. Patient assisted into her wheelchair. No s/s of distress noted at time of her discharge. Therisa Bachelor, RN

## 2024-01-09 ENCOUNTER — Other Ambulatory Visit: Payer: Self-pay

## 2024-01-09 ENCOUNTER — Ambulatory Visit
Admission: RE | Admit: 2024-01-09 | Discharge: 2024-01-09 | Disposition: A | Discharge status: Home / Self Care | Source: Ambulatory Visit | Attending: NURSE PRACTITIONER | Admitting: NURSE PRACTITIONER

## 2024-01-09 VITALS — BP 125/78 | HR 93 | Temp 97.9°F | Resp 20

## 2024-01-09 DIAGNOSIS — D509 Iron deficiency anemia, unspecified: Secondary | ICD-10-CM | POA: Insufficient documentation

## 2024-01-09 MED ORDER — ACETAMINOPHEN 325 MG TABLET
975.0000 mg | ORAL_TABLET | Freq: Once | ORAL | Status: DC | PRN
Start: 2024-01-09 — End: 2024-01-10

## 2024-01-09 MED ORDER — ALBUTEROL SULFATE 2.5 MG/3 ML (0.083 %) SOLUTION FOR NEBULIZATION
2.5000 mg | INHALATION_SOLUTION | Freq: Once | RESPIRATORY_TRACT | Status: DC | PRN
Start: 2024-01-09 — End: 2024-01-10

## 2024-01-09 MED ORDER — FAMOTIDINE (PF) 20 MG/2 ML INTRAVENOUS SOLUTION
20.0000 mg | Freq: Once | INTRAVENOUS | Status: DC | PRN
Start: 2024-01-09 — End: 2024-01-10

## 2024-01-09 MED ORDER — SODIUM CHLORIDE 0.9% FLUSH BAG - 250 ML
INTRAVENOUS | Status: DC | PRN
Start: 2024-01-09 — End: 2024-01-10

## 2024-01-09 MED ORDER — PROCHLORPERAZINE MALEATE 10 MG TABLET
10.0000 mg | ORAL_TABLET | Freq: Once | ORAL | Status: DC | PRN
Start: 2024-01-09 — End: 2024-01-10

## 2024-01-09 MED ORDER — LORATADINE 10 MG TABLET
20.0000 mg | ORAL_TABLET | Freq: Once | ORAL | Status: DC | PRN
Start: 2024-01-09 — End: 2024-01-10

## 2024-01-09 MED ORDER — HYDROCORTISONE SOD SUCCINATE 100 MG/2 ML VIAL WRAPPER
100.0000 mg | Freq: Once | INTRAMUSCULAR | Status: DC | PRN
Start: 2024-01-09 — End: 2024-01-10

## 2024-01-09 MED ORDER — EPINEPHRINE 1 MG/ML (1 ML) INJECTION SOLUTION
0.3000 mg | Freq: Once | INTRAMUSCULAR | Status: DC | PRN
Start: 2024-01-09 — End: 2024-01-10

## 2024-01-09 MED ORDER — SODIUM CHLORIDE 0.9 % INTRAVENOUS SOLUTION
300.0000 mg | Freq: Once | INTRAVENOUS | Status: AC
Start: 2024-01-09 — End: 2024-01-09
  Administered 2024-01-09: 0 mg via INTRAVENOUS
  Administered 2024-01-09: 300 mg via INTRAVENOUS
  Filled 2024-01-09: qty 15

## 2024-01-09 MED ORDER — ALBUTEROL SULFATE HFA 90 MCG/ACTUATION AEROSOL INHALER - RN
2.0000 | Freq: Once | RESPIRATORY_TRACT | Status: DC | PRN
Start: 2024-01-09 — End: 2024-01-10

## 2024-01-09 MED ORDER — SODIUM CHLORIDE 0.9 % IV BOLUS
500.0000 mL | INJECTION | Freq: Once | Status: DC | PRN
Start: 2024-01-09 — End: 2024-01-10

## 2024-01-09 MED ORDER — DEXTROSE 5% IN WATER (D5W) FLUSH BAG - 250 ML
INTRAVENOUS | Status: DC | PRN
Start: 2024-01-09 — End: 2024-01-10

## 2024-01-09 NOTE — Nurses Notes (Signed)
 Summary: pt arrived an received iv iron  infusion with no reactions or distress noted during or after infusion.Assessment  WNL.infusion complete,iv removed with pressure dressing applied.pt down via walking at DC no concerns voiced upon leaving floor.Marshall Shropshire, LPN

## 2024-01-16 ENCOUNTER — Other Ambulatory Visit: Payer: Self-pay

## 2024-01-16 ENCOUNTER — Ambulatory Visit
Admission: RE | Admit: 2024-01-16 | Discharge: 2024-01-16 | Disposition: A | Discharge status: Home / Self Care | Source: Ambulatory Visit | Attending: NURSE PRACTITIONER | Admitting: NURSE PRACTITIONER

## 2024-01-16 VITALS — BP 108/69 | HR 92 | Temp 98.3°F | Resp 20

## 2024-01-16 DIAGNOSIS — D509 Iron deficiency anemia, unspecified: Secondary | ICD-10-CM | POA: Insufficient documentation

## 2024-01-16 MED ORDER — ACETAMINOPHEN 325 MG TABLET
975.0000 mg | ORAL_TABLET | Freq: Once | ORAL | Status: DC | PRN
Start: 2024-01-16 — End: 2024-01-17

## 2024-01-16 MED ORDER — SODIUM CHLORIDE 0.9 % IV BOLUS
500.0000 mL | INJECTION | Freq: Once | Status: DC | PRN
Start: 2024-01-16 — End: 2024-01-17

## 2024-01-16 MED ORDER — DEXTROSE 5% IN WATER (D5W) FLUSH BAG - 250 ML
INTRAVENOUS | Status: DC | PRN
Start: 2024-01-16 — End: 2024-01-17

## 2024-01-16 MED ORDER — LORATADINE 10 MG TABLET
20.0000 mg | ORAL_TABLET | Freq: Once | ORAL | Status: DC | PRN
Start: 2024-01-16 — End: 2024-01-17

## 2024-01-16 MED ORDER — SODIUM CHLORIDE 0.9 % INTRAVENOUS SOLUTION
300.0000 mg | Freq: Once | INTRAVENOUS | Status: AC
Start: 2024-01-16 — End: 2024-01-16
  Administered 2024-01-16: 0 mg via INTRAVENOUS
  Administered 2024-01-16: 300 mg via INTRAVENOUS
  Filled 2024-01-16: qty 15

## 2024-01-16 MED ORDER — HYDROCORTISONE SOD SUCCINATE 100 MG/2 ML VIAL WRAPPER
100.0000 mg | Freq: Once | INTRAMUSCULAR | Status: DC | PRN
Start: 2024-01-16 — End: 2024-01-17

## 2024-01-16 MED ORDER — ALBUTEROL SULFATE HFA 90 MCG/ACTUATION AEROSOL INHALER - RN
2.0000 | Freq: Once | RESPIRATORY_TRACT | Status: DC | PRN
Start: 2024-01-16 — End: 2024-01-17

## 2024-01-16 MED ORDER — PROCHLORPERAZINE MALEATE 10 MG TABLET
10.0000 mg | ORAL_TABLET | Freq: Once | ORAL | Status: DC | PRN
Start: 2024-01-16 — End: 2024-01-17

## 2024-01-16 MED ORDER — ALBUTEROL SULFATE 2.5 MG/3 ML (0.083 %) SOLUTION FOR NEBULIZATION
2.5000 mg | INHALATION_SOLUTION | Freq: Once | RESPIRATORY_TRACT | Status: DC | PRN
Start: 2024-01-16 — End: 2024-01-17

## 2024-01-16 MED ORDER — FAMOTIDINE (PF) 20 MG/2 ML INTRAVENOUS SOLUTION
20.0000 mg | Freq: Once | INTRAVENOUS | Status: DC | PRN
Start: 2024-01-16 — End: 2024-01-17

## 2024-01-16 MED ORDER — SODIUM CHLORIDE 0.9% FLUSH BAG - 250 ML
INTRAVENOUS | Status: DC | PRN
Start: 2024-01-16 — End: 2024-01-17

## 2024-01-16 MED ORDER — EPINEPHRINE 1 MG/ML (1 ML) INJECTION SOLUTION
0.3000 mg | Freq: Once | INTRAMUSCULAR | Status: DC | PRN
Start: 2024-01-16 — End: 2024-01-17

## 2024-01-16 NOTE — Nurses Notes (Signed)
 Summary: pt arrived an received iv iron  infusion with no reactions or distress noted during or after infusion.assessment   WNL.infusion complete,iv removed with pressure dressing applied.pt down via walking at DC no concerns voiced upon leaving floor.Marshall Shropshire, LPN

## 2024-01-23 ENCOUNTER — Ambulatory Visit
Admission: RE | Admit: 2024-01-23 | Discharge: 2024-01-23 | Disposition: A | Discharge status: Home / Self Care | Payer: Self-pay | Source: Ambulatory Visit | Attending: NURSE PRACTITIONER | Admitting: NURSE PRACTITIONER

## 2024-01-23 ENCOUNTER — Other Ambulatory Visit: Payer: Self-pay

## 2024-01-23 VITALS — BP 111/77 | HR 77 | Temp 98.2°F | Resp 20

## 2024-01-23 DIAGNOSIS — D509 Iron deficiency anemia, unspecified: Secondary | ICD-10-CM | POA: Insufficient documentation

## 2024-01-23 DIAGNOSIS — Z23 Encounter for immunization: Secondary | ICD-10-CM | POA: Insufficient documentation

## 2024-01-23 MED ORDER — EPINEPHRINE 1 MG/ML (1 ML) INJECTION SOLUTION
0.3000 mg | Freq: Once | INTRAMUSCULAR | Status: DC | PRN
Start: 2024-01-23 — End: 2024-01-24

## 2024-01-23 MED ORDER — HYDROCORTISONE SOD SUCCINATE 100 MG/2 ML VIAL WRAPPER
100.0000 mg | Freq: Once | INTRAMUSCULAR | Status: DC | PRN
Start: 2024-01-23 — End: 2024-01-24

## 2024-01-23 MED ORDER — SODIUM CHLORIDE 0.9 % INTRAVENOUS SOLUTION
300.0000 mg | Freq: Once | INTRAVENOUS | Status: AC
Start: 2024-01-23 — End: 2024-01-23
  Administered 2024-01-23: 300 mg via INTRAVENOUS
  Administered 2024-01-23: 0 mg via INTRAVENOUS
  Filled 2024-01-23: qty 15

## 2024-01-23 MED ORDER — FLU VACCINE TS 2025-26(6MOS UP)(PF) 45 MCG(15MCG X3)/0.5 ML IM SYRINGE
0.5000 mL | INJECTION | Freq: Once | INTRAMUSCULAR | Status: AC
Start: 2024-01-23 — End: 2024-01-23
  Administered 2024-01-23: 0.5 mL via INTRAMUSCULAR
  Filled 2024-01-23: qty 0.5

## 2024-01-23 MED ORDER — ALBUTEROL SULFATE 2.5 MG/3 ML (0.083 %) SOLUTION FOR NEBULIZATION
2.5000 mg | INHALATION_SOLUTION | Freq: Once | RESPIRATORY_TRACT | Status: DC | PRN
Start: 2024-01-23 — End: 2024-01-24

## 2024-01-23 MED ORDER — PROCHLORPERAZINE MALEATE 10 MG TABLET
10.0000 mg | ORAL_TABLET | Freq: Once | ORAL | Status: DC | PRN
Start: 2024-01-23 — End: 2024-01-24

## 2024-01-23 MED ORDER — FAMOTIDINE (PF) 20 MG/2 ML INTRAVENOUS SOLUTION
20.0000 mg | Freq: Once | INTRAVENOUS | Status: DC | PRN
Start: 2024-01-23 — End: 2024-01-24

## 2024-01-23 MED ORDER — ALBUTEROL SULFATE HFA 90 MCG/ACTUATION AEROSOL INHALER - RN
2.0000 | Freq: Once | RESPIRATORY_TRACT | Status: DC | PRN
Start: 2024-01-23 — End: 2024-01-24

## 2024-01-23 MED ORDER — SODIUM CHLORIDE 0.9 % IV BOLUS
500.0000 mL | INJECTION | Freq: Once | Status: DC | PRN
Start: 2024-01-23 — End: 2024-01-24

## 2024-01-23 MED ORDER — DEXTROSE 5% IN WATER (D5W) FLUSH BAG - 250 ML
INTRAVENOUS | Status: DC | PRN
Start: 2024-01-23 — End: 2024-01-24

## 2024-01-23 MED ORDER — ACETAMINOPHEN 325 MG TABLET
975.0000 mg | ORAL_TABLET | Freq: Once | ORAL | Status: DC | PRN
Start: 2024-01-23 — End: 2024-01-24

## 2024-01-23 MED ORDER — LORATADINE 10 MG TABLET
20.0000 mg | ORAL_TABLET | Freq: Once | ORAL | Status: DC | PRN
Start: 2024-01-23 — End: 2024-01-24

## 2024-01-23 MED ORDER — SODIUM CHLORIDE 0.9% FLUSH BAG - 250 ML
INTRAVENOUS | Status: DC | PRN
Start: 2024-01-23 — End: 2024-01-24

## 2024-01-23 NOTE — Nurses Notes (Signed)
 Summary: pt arrived an received iv iron  infusion with no reactions or distress noted during or after infusion.assessment  WNL.Infusion complete iv removed with pressure dress applied.no concerns voiced upon leaving floor.Marshall Shropshire, LPN

## 2024-02-03 ENCOUNTER — Other Ambulatory Visit (INDEPENDENT_AMBULATORY_CARE_PROVIDER_SITE_OTHER): Payer: Self-pay | Admitting: NURSE PRACTITIONER

## 2024-02-03 ENCOUNTER — Encounter (HOSPITAL_COMMUNITY): Payer: Self-pay | Admitting: NURSE PRACTITIONER

## 2024-02-03 ENCOUNTER — Encounter (INDEPENDENT_AMBULATORY_CARE_PROVIDER_SITE_OTHER): Payer: Self-pay | Admitting: NURSE PRACTITIONER

## 2024-02-03 DIAGNOSIS — D509 Iron deficiency anemia, unspecified: Secondary | ICD-10-CM

## 2024-03-10 ENCOUNTER — Encounter (INDEPENDENT_AMBULATORY_CARE_PROVIDER_SITE_OTHER): Payer: Self-pay | Admitting: Physician Assistant

## 2024-03-26 ENCOUNTER — Ambulatory Visit (INDEPENDENT_AMBULATORY_CARE_PROVIDER_SITE_OTHER): Payer: Self-pay | Admitting: NURSE PRACTITIONER

## 2024-04-03 ENCOUNTER — Encounter (INDEPENDENT_AMBULATORY_CARE_PROVIDER_SITE_OTHER): Payer: Self-pay | Admitting: NURSE PRACTITIONER

## 2024-04-03 ENCOUNTER — Ambulatory Visit: Payer: Self-pay | Attending: NURSE PRACTITIONER | Admitting: NURSE PRACTITIONER

## 2024-04-03 ENCOUNTER — Other Ambulatory Visit: Payer: Self-pay

## 2024-04-03 ENCOUNTER — Ambulatory Visit (INDEPENDENT_AMBULATORY_CARE_PROVIDER_SITE_OTHER): Admission: RE | Admit: 2024-04-03 | Discharge: 2024-04-03 | Payer: Self-pay | Attending: NURSE PRACTITIONER

## 2024-04-03 DIAGNOSIS — E559 Vitamin D deficiency, unspecified: Secondary | ICD-10-CM

## 2024-04-03 DIAGNOSIS — D509 Iron deficiency anemia, unspecified: Secondary | ICD-10-CM

## 2024-04-03 DIAGNOSIS — Z9884 Bariatric surgery status: Secondary | ICD-10-CM | POA: Insufficient documentation

## 2024-04-03 DIAGNOSIS — Z89612 Acquired absence of left leg above knee: Secondary | ICD-10-CM | POA: Insufficient documentation

## 2024-04-03 DIAGNOSIS — Z7401 Bed confinement status: Secondary | ICD-10-CM | POA: Insufficient documentation

## 2024-04-03 DIAGNOSIS — Z79899 Other long term (current) drug therapy: Secondary | ICD-10-CM | POA: Insufficient documentation

## 2024-04-03 LAB — COMPREHENSIVE METABOLIC PANEL, NON-FASTING
ALBUMIN/GLOBULIN RATIO: 1.5 — ABNORMAL HIGH (ref 0.8–1.4)
ALBUMIN: 3.5 g/dL (ref 3.5–5.7)
ALKALINE PHOSPHATASE: 75 U/L (ref 34–104)
ALT (SGPT): 24 U/L (ref 7–52)
ANION GAP: 6 mmol/L (ref 4–13)
AST (SGOT): 22 U/L (ref 13–39)
BILIRUBIN TOTAL: 0.6 mg/dL (ref 0.3–1.0)
BUN/CREA RATIO: 18 (ref 6–22)
BUN: 20 mg/dL (ref 7–25)
CALCIUM, CORRECTED: 8.9 mg/dL (ref 8.9–10.8)
CALCIUM: 8.5 mg/dL — ABNORMAL LOW (ref 8.6–10.3)
CHLORIDE: 106 mmol/L (ref 98–107)
CO2 TOTAL: 29 mmol/L (ref 21–31)
CREATININE: 1.13 mg/dL (ref 0.60–1.30)
ESTIMATED GFR: 59 mL/min/1.73mˆ2 — ABNORMAL LOW (ref >59)
GLOBULIN: 2.3 (ref 2.0–3.5)
GLUCOSE: 107 mg/dL (ref 74–109)
OSMOLALITY, CALCULATED: 284 mosm/kg (ref 270–290)
POTASSIUM: 4.3 mmol/L (ref 3.5–5.1)
PROTEIN TOTAL: 5.8 g/dL — ABNORMAL LOW (ref 6.4–8.9)
SODIUM: 141 mmol/L (ref 136–145)

## 2024-04-03 LAB — IRON TRANSFERRIN AND TIBC
IRON (TRANSFERRIN) SATURATION: 38 % (ref 15–50)
IRON: 120 ug/dL (ref 50–212)
TOTAL IRON BINDING CAPACITY: 316 ug/dL (ref 250–450)
TRANSFERRIN: 226 mg/dL (ref 203–362)
UIBC: 196 ug/dL (ref 130–375)

## 2024-04-03 LAB — CBC WITH DIFF
BASOPHIL #: 0.1 x10ˆ3/uL (ref 0.00–0.10)
BASOPHIL %: 1 % (ref 0–1)
EOSINOPHIL #: 0.2 x10ˆ3/uL (ref 0.00–0.50)
EOSINOPHIL %: 3 % (ref 1–7)
HCT: 44.6 % — ABNORMAL HIGH (ref 31.2–41.9)
HGB: 15.1 g/dL — ABNORMAL HIGH (ref 10.9–14.3)
LYMPHOCYTE #: 2.2 x10ˆ3/uL (ref 1.10–3.10)
LYMPHOCYTE %: 26 % (ref 16–46)
MCH: 31.9 pg (ref 24.7–32.8)
MCHC: 33.8 g/dL (ref 32.3–35.6)
MCV: 94.2 fL (ref 75.5–95.3)
MONOCYTE #: 0.8 x10ˆ3/uL (ref 0.20–0.90)
MONOCYTE %: 9 % (ref 4–11)
MPV: 8.3 fL (ref 7.9–10.8)
NEUTROPHIL #: 5.1 x10ˆ3/uL (ref 1.90–8.20)
NEUTROPHIL %: 62 % (ref 43–77)
PLATELETS: 239 x10ˆ3/uL (ref 140–440)
RBC: 4.74 x10ˆ6/uL (ref 3.63–4.92)
RDW: 14.3 % (ref 12.3–17.7)
WBC: 8.4 x10ˆ3/uL (ref 3.8–11.8)

## 2024-04-03 LAB — MAGNESIUM: MAGNESIUM: 1.6 mg/dL — ABNORMAL LOW (ref 1.9–2.7)

## 2024-04-03 LAB — FERRITIN: FERRITIN: 80 ng/mL (ref 11–307)

## 2024-04-03 LAB — VITAMIN D 25 TOTAL: VITAMIN D 25, TOTAL: 102.46 ng/mL — ABNORMAL HIGH (ref 30.00–100.00)

## 2024-04-03 NOTE — Cancer Center Note (Signed)
 Department of Hematology/Oncology  History and Physical    Name: Abigail Carlson  FMW:[id number]  Date of Birth: 1975-08-08  Encounter Date: 04/03/2024    REFERRING PROVIDER:  Alton Joen CROME, APRN, CNP  [address]  [address]  [place],  [place] [address]    REASON FOR OFFICE VISIT:  Follow up for evaluation and management of  iron -deficiency anemia.    HISTORY OF PRESENT ILLNESS:  Abigail Carlson is a 48 y.o. female who presents today for iron -deficiency anemia.  She was initially seen at [facility] on October 08, 2013.  She was last seen on 07/11/2020 at [facility].  She has had Imferon in the past and tolerated it well.  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  She had back surgery on 07/01/23. She states she is doing fairly well. She complains of increased fatigue and bruising. She is concerned about her iron , magnesium  and Vitamin D  levels.     ROS:   Pertinent review of systems as discussed in HPI    History:  Past Medical History:   Diagnosis Date    AKA stump complication (CMS HCC)     Anemia     Anxiety state     Asthma     Carpal tunnel syndrome     Depression     Diabetes mellitus, type 2     Esophageal reflux     Fibromyalgia     Hiatal hernia     History of IBS     Hypovitaminosis D     Kidney stones     Migraines     Osteoarthritis     PTSD (post-traumatic stress disorder)     RLS (restless legs syndrome)     SCC (squamous cell carcinoma)     Systemic lupus erythematosus      Past Surgical History:   Procedure Laterality Date    HX APPENDECTOMY      HX CERVICAL DISKECTOMY      HX CHOLECYSTECTOMY      HX GASTRIC BYPASS      HX TONSILLECTOMY      LAMINECTOMY      LEG AMPUTATION Left     REPLACEMENT TOTAL KNEE BILATERAL      SPINAL FUSION       Social History     Socioeconomic History    Marital status: Single     Spouse name: Not on  file    Number of children: Not on file    Years of education: Not on file    Highest education level: Not on file   Occupational History    Not on file   Tobacco Use    Smoking status: Never    Smokeless tobacco: Never   Vaping Use    Vaping status: Never Used   Substance and Sexual Activity    Alcohol use: Never    Drug use: Never    Sexual activity: Not on file   Other Topics Concern    Not on file   Social History Narrative    Not on file     Social Determinants of Health     Financial Resource Strain: Low Risk  (07/02/2023)    Received from [facility]    Overall Financial Resource Strain (CARDIA)     Difficulty of Paying Living Expenses: Not hard at all  Transportation Needs: No Transportation Needs (07/02/2023)    Received from [facility] - [facility]     Lack of Transportation (Medical): No     Lack of Transportation (Non-Medical): No   Social Connections: Moderately Isolated (07/02/2023)    Received from [facility]    Social Connection and Isolation Panel     In a typical week, how many times do you talk on the phone with family, friends, or neighbors?: More than three times a week     Frequency of Social Gatherings with Friends and Family: Not on file     How often do you attend church or religious services?: More than 4 times per year     Do you belong to any clubs or organizations such as church groups, unions, fraternal or athletic groups, or school groups?: No     How often do you attend meetings of the clubs or organizations you belong to?: Never     Are you married, widowed, divorced, separated, never married, or living with a partner?: Never married   Intimate Partner Violence: Not At Risk (07/02/2023)    Received from [facility]    Humiliation, Afraid, Rape, and Kick questionnaire     Within the last year, have you been afraid of your partner or ex-partner?: No     Within the last year, have you been humiliated or emotionally abused in other ways by your partner or  ex-partner?: No     Within the last year, have you been kicked, hit, slapped, or otherwise physically hurt by your partner or ex-partner?: No     Within the last year, have you been raped or forced to have any kind of sexual activity by your partner or ex-partner?: No   Housing Stability: Unknown (07/02/2023)    Received from [facility] Stability Vital Sign     In the last 12 months, was there a time when you were not able to pay the mortgage or rent on time?: No     Number of Times Moved in the Last Year: Not on file     At any time in the past 12 months, were you homeless or living in a shelter (including now)?: No     Social History     Social History Narrative    Not on file     Social History     Substance and Sexual Activity   Drug Use Never     Family Medical History:       Problem Relation (Age of Onset)    Elevated Lipids Father    Heart Disease Father    Hypertension (High Blood Pressure) Mother    Kidney Cancer Mother    Migraines Mother          Current Outpatient Medications   Medication Sig    acetaminophen  (TYLENOL ) 500 mg Oral Tablet Take 2 Tablets (1,000 mg total) by mouth    albuterol  sulfate (PROVENTIL  OR VENTOLIN  OR PROAIR ) 90 mcg/actuation Inhalation oral inhaler Take 1-2 Puffs by inhalation Every 6 hours as needed    Alcohol Swabs Pads, Medicated Apply 1 Swab topically    ALPRAZolam (XANAX) 0.5 mg Oral Tablet Take 1 Tablet (0.5 mg total) by mouth Every night as needed for Insomnia    ascorbic acid, vitamin C, (VITAMIN C) 250 mg Oral Tablet Take 1 Tablet (250 mg total) by mouth    aspirin 81 mg Oral Tablet, Chewable Chew 1 Tablet (  81 mg total) Once a day    Blood Sugar Diagnostic Strip 1 Strip    buPROPion (WELLBUTRIN XL) 150 mg extended release 24 hr tablet Take 2 Tablets (300 mg total) by mouth    cholecalciferol , vitamin D3, 25 mcg (1,000 unit) Oral Tablet Take 1 Tablet (1,000 Units total) by mouth Once a day    citalopram (CELEXA) 20 mg Oral Tablet Take 1 Tablet (20 mg total)  by mouth Once a day    clobetasoL (TEMOVATE) 0.05 % Cream APPLY TWICE A DAY TO RASH ON ARMS X 1 MONTH. DO NOT APPLY TO FACE.    Dexlansoprazole (DEXILANT) 60 mg Oral Cap, Delayed Rel., Multiphasic Take 1 Capsule (60 mg total) by mouth Once a day    Diaper,Brief, Adult,Disposable Misc     diclofenac sodium (VOLTAREN) 1 % Gel APPLY TO AFFECTED AREA TWICE DAILY AS NEEDED    diphenoxylate-atropine (LOMOTIL) 2.5-0.025 mg Oral Tablet     EPINEPHrine  0.3 mg/0.3 mL Injection Auto-Injector USE AS DIRECTED AS NEEDED    ferrous sulfate  (FEOSOL) 325 mg (65 mg iron ) Oral Tablet TAKE 1 TABLET BY MOUTH EVERY OTHER DAY    gabapentin (NEURONTIN) 600 mg Oral Tablet Take 1 Tablet (600 mg total) by mouth Three times a day    hydrocortisone  2.5 % Cream two times daily .    hydrOXYzine HCL (ATARAX) 25 mg Oral Tablet     Levetiracetam 500 mg Oral Tablet Sustained Release 24 hr Take 1 Tablet (500 mg total) by mouth Every night    magnesium  oxide (MAG-OX) 400 mg Oral Tablet Take 1 Tablet (400 mg total) by mouth Once a day    melatonin 3 mg Oral Tablet Take 10 mg by mouth    metFORMIN (GLUCOPHAGE) 500 mg Oral Tablet TAKE 2 TABLETS BY MOUTH TWICE DAILY WITH A MEAL    methocarbamoL (ROBAXIN) 500 mg Oral Tablet TAKE 1 TABLET FOUR TIMES DAILY AS NEEDED BY MOUTH FOR MUSCLE SPASMS OR PAIN.    metoprolol tartrate (LOPRESSOR) 25 mg Oral Tablet Take 1 Tablet (25 mg total) by mouth    mirabegron  (MYRBETRIQ ) 50 mg Oral Tablet Sustained Release 24 hr Take 1 Tablet (50 mg total) by mouth Daily    mupirocin (BACTROBAN) 2 % Ointment Apply 1 Application topically    naloxone (NARCAN) 4 mg per spray nasal spray INHALE 1 SPRAY (4 MG) INTO NOSTRIL ONE TIME IF NEEDED    nitroGLYCERIN (NITROSTAT) 0.4 mg Sublingual Tablet, Sublingual PLACE 1 TABLET UNDER TONGUE EVERY 5 MINS, UP TO 3 DOSES AS NEEDED FOR CHEST PAIN    nystatin (NYSTOP) 100,000 unit/gram Powder Apply topically    ondansetron (ZOFRAN) 8 mg Oral Tablet     oxyCODONE (ROXICODONE) 15 mg Oral Tablet Take  1 Tablet (15 mg total) by mouth    OZEMPIC 1 mg/dose (4 mg/3 mL) Subcutaneous Pen Injector INJECT 1 MG SUBCUTANEOUSLY WEEKLY    Pen Needle, Disposable, 31 gauge x 3/16 Needle Inject 1 Each under the skin    phentermine (ADIPEX-P) 37.5 mg Oral Tablet Take 1 Tablet (37.5 mg total) by mouth    prednisoLONE acetate (PRED FORTE) 1 % Ophthalmic Drops, Suspension     predniSONE (DELTASONE) 10 mg Oral Tablet TAKE 1 TABLET BY MOUTH TWICE A DAY AS NEEDED FOR LUPUS FLARE    prochlorperazine  (COMPAZINE ) 10 mg Oral Tablet Take 1 Tablet (10 mg total) by mouth    RESTASIS MULTIDOSE 0.05 % Ophthalmic Drops USE 1 DROP IN EACH EYE TWICE A DAY  riboflavin, vitamin B2, (VITAMIN B-2) 100 mg Oral Tablet Take 1 Tablet (100 mg total) by mouth    rizatriptan (MAXALT) 10 mg Oral Tablet TAKE 1 TABLET AS NEEDED ON ONSET OF HEADACHE, MAY REPEAT AFTER 2 HOURS UP TO 2 IN 24 HOURS    rOPINIRole (REQUIP) 0.5 mg Oral Tablet TAKE 1 TABLET BY MOUTH EVERY DAY 1 TO 3 HOURS BEFORE BEDTIME    silver sulfADIAZINE (SILVADENE) 1 % Cream     solifenacin  (VESICARE ) 10 mg Oral Tablet Take 1 Tablet (10 mg total) by mouth Daily    SYMBICORT 160-4.5 mcg/actuation Inhalation oral inhaler 2 Puffs Twice daily    triamcinolone acetonide 0.1 % Cream APPLY TO AFFECTED AREA TWICE A DAY FOR 2WEEKS OUT OF THE MONTH OR LESS    TYMLOS 80 mcg (3,120 mcg/1.56 mL) Subcutaneous Pen Injector Inject 80 mcg under the skin    vitamin A (AQUASOL A) 3,000 mcg (10,000 unit) Oral Capsule Take 1 Capsule (10,000 Units total) by mouth Once a day 1 a day    vitamin E 400 unit Oral Capsule Take 1 Capsule (400 Units total) by mouth Once a day       Allergies   Allergen Reactions    Morphine Anaphylaxis    Augmentin [Amoxicillin-Pot Clavulanate]     Azithromycin     Celebrex [Celecoxib]     Cephalosporins     Chlorhexidine Towelette     Clavulanic Acid     Codeine     Cymbalta [Duloxetine]     Penicillins     Tramadol      PHYSICAL EXAM:  BP 118/76 (Site: Right Wrist, Patient Position:  Sitting, Cuff Size: Adult)   Pulse 75   Temp 36.1 C (97 F) (Temporal)   Ht 1.626 m (5' 4)   Wt 87.7 kg (193 lb 6.4 oz)   SpO2 94%   BMI 33.20 kg/m      ECOG Status: (3) Capable of limited self-care, confined to bed or chair > 50% of waking hours   Physical Exam  HENT:      Mouth/Throat:      Mouth: Mucous membranes are moist.      Pharynx: Oropharynx is clear.   Eyes:      Extraocular Movements: Extraocular movements intact.   Cardiovascular:      Rate and Rhythm: Normal rate and regular rhythm.      Pulses: Normal pulses.      Heart sounds: Normal heart sounds.   Pulmonary:      Effort: Pulmonary effort is normal.      Breath sounds: Normal breath sounds.   Abdominal:      Palpations: Abdomen is soft.   Musculoskeletal:      Cervical back: Normal range of motion and neck supple.   Skin:       Neurological:      Mental Status: She is alert.        LABS:     CBC  Diff   Lab Results   Component Value Date/Time    WBC 8.9 01/02/2024 10:37 AM    HGB 13.1 01/02/2024 10:37 AM    HCT 39.0 01/02/2024 10:37 AM    PLTCNT 246 01/02/2024 10:37 AM    RBC 4.31 01/02/2024 10:37 AM    MCV 90.6 01/02/2024 10:37 AM    MCHC 33.7 01/02/2024 10:37 AM    MCH 30.5 01/02/2024 10:37 AM    RDW 15.8 01/02/2024 10:37 AM    MPV 8.0 01/02/2024 10:37  AM    Lab Results   Component Value Date/Time    PMNS 80 (H) 01/02/2024 10:37 AM    LYMPHOCYTES 14 (L) 01/02/2024 10:37 AM    EOSINOPHIL 2 01/02/2024 10:37 AM    MONOCYTES 5 01/02/2024 10:37 AM    BASOPHILS 0 01/02/2024 10:37 AM    BASOPHILS 0.00 01/02/2024 10:37 AM    PMNABS 7.10 01/02/2024 10:37 AM    LYMPHSABS 1.20 01/02/2024 10:37 AM    EOSABS 0.10 01/02/2024 10:37 AM    MONOSABS 0.40 01/02/2024 10:37 AM        Comprehensive Metabolic Profile    Lab Results   Component Value Date    SODIUM 142 01/02/2024    POTASSIUM 4.2 01/02/2024    CHLORIDE 105 01/02/2024    CO2 34 (H) 01/02/2024    ANIONGAP 3 (L) 01/02/2024    BUN 18 01/02/2024    CREATININE 1.06 01/02/2024    ALBUMIN 3.4 (L)  01/02/2024    CALCIUM 8.6 01/02/2024    GLUCOSENF 72 (L) 01/02/2024    ALKPHOS 60 01/02/2024    ALT 20 01/02/2024    AST 22 01/02/2024    TOTBILIRUBIN 0.5 01/02/2024    TOTALPROTEIN 5.6 (L) 01/02/2024     ESTIMATED GFR   Date Value Ref Range Status   01/02/2024 64 >59 mL/min/1.73m2 Final     IRON    Date Value Ref Range Status   01/02/2024 44 (L) 50 - 212 ug/dL Final     FERRITIN   Date Value Ref Range Status   01/02/2024 13 11 - 307 ng/mL Final     TOTAL IRON  BINDING CAPACITY   Date Value Ref Range Status   01/02/2024 340 250 - 450 ug/dL Final     UIBC   Date Value Ref Range Status   01/02/2024 296 130 - 375 ug/dL Final     IRON  (TRANSFERRIN) SATURATION   Date Value Ref Range Status   01/02/2024 13 (L) 15 - 50 % Final     VITAMIN B 12   Date Value Ref Range Status   06/20/2023 645 180 - 914 pg/mL Final       ASSESSMENT:    ICD-10-CM    1. Hypomagnesemia  E83.42 MAGNESIUM       2. Vitamin D  deficiency  E55.9 VITAMIN D  25 TOTAL            PLAN:   Iron  deficiency anemia  Suspected iron  deficiency with pending iron  levels.  - Order iron  supplementation if needed.     Hypomagnesemia (suspected)  Suspected hypomagnesemia with pending magnesium  levels.  - Monitor magnesium  levels.  - Add magnesium  supplementation if low.     She will follow up with us  in 3 months or sooner if needed.       Abigail Carlson was given the chance to ask questions, and these were answered to their satisfaction. The patient is welcome to call with any questions or concerns in the meantime.     On the day of the encounter, a total of  30 minutes was spent on this patient encounter including review of historical information, examination, documentation and post-visit activities.   Return in about 3 months (around 07/02/2024).     Abigail Sers, APRN,FNP-BC ,04/03/2024 ,12:15     CC:  Joen LITTIE Hazel, APRN, CNP  [address]  [place] [address]    Hazel Joen LITTIE, APRN, CNP  [address]  [address]  [place],  [place] [address]       This note was partially generated using MModal Fluency Direct system, and there may be some incorrect words, spellings, and punctuation that were not noted in checking the note before saving.

## 2024-06-30 ENCOUNTER — Encounter (INDEPENDENT_AMBULATORY_CARE_PROVIDER_SITE_OTHER): Payer: Self-pay | Admitting: Physician Assistant

## 2024-07-02 ENCOUNTER — Ambulatory Visit (INDEPENDENT_AMBULATORY_CARE_PROVIDER_SITE_OTHER): Payer: Self-pay | Admitting: NURSE PRACTITIONER
